# Patient Record
Sex: Female | Born: 1990
Health system: Southern US, Community
[De-identification: ages and names within clinical notes are randomized; demographics above are authoritative.]

## PROBLEM LIST (undated history)

## (undated) DIAGNOSIS — A879 Viral meningitis, unspecified: Secondary | ICD-10-CM

## (undated) DIAGNOSIS — B999 Unspecified infectious disease: Secondary | ICD-10-CM

## (undated) DIAGNOSIS — F329 Major depressive disorder, single episode, unspecified: Secondary | ICD-10-CM

## (undated) DIAGNOSIS — O24419 Gestational diabetes mellitus in pregnancy, unspecified control: Secondary | ICD-10-CM

## (undated) DIAGNOSIS — T8859XA Other complications of anesthesia, initial encounter: Secondary | ICD-10-CM

## (undated) DIAGNOSIS — Z8489 Family history of other specified conditions: Secondary | ICD-10-CM

## (undated) DIAGNOSIS — N809 Endometriosis, unspecified: Secondary | ICD-10-CM

## (undated) DIAGNOSIS — F419 Anxiety disorder, unspecified: Secondary | ICD-10-CM

## (undated) DIAGNOSIS — Z98891 History of uterine scar from previous surgery: Secondary | ICD-10-CM

## (undated) DIAGNOSIS — T4145XA Adverse effect of unspecified anesthetic, initial encounter: Secondary | ICD-10-CM

## (undated) DIAGNOSIS — F32A Depression, unspecified: Secondary | ICD-10-CM

## (undated) HISTORY — DX: Anxiety disorder, unspecified: F41.9

## (undated) HISTORY — DX: Depression, unspecified: F32.A

## (undated) HISTORY — DX: Viral meningitis, unspecified: A87.9

## (undated) HISTORY — DX: Endometriosis, unspecified: N80.9

## (undated) HISTORY — DX: Other complications of anesthesia, initial encounter: T88.59XA

## (undated) HISTORY — DX: History of uterine scar from previous surgery: Z98.891

---
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
## 2008-06-16 ENCOUNTER — Emergency Department (HOSPITAL_COMMUNITY): Admission: EM | Admit: 2008-06-16 | Discharge: 2008-06-16 | Payer: Self-pay | Admitting: Emergency Medicine

## 2008-07-21 ENCOUNTER — Emergency Department (HOSPITAL_COMMUNITY): Admission: EM | Admit: 2008-07-21 | Discharge: 2008-07-21 | Payer: Self-pay | Admitting: Emergency Medicine

## 2008-11-09 ENCOUNTER — Emergency Department (HOSPITAL_COMMUNITY): Admission: EM | Admit: 2008-11-09 | Discharge: 2008-11-09 | Payer: Self-pay | Admitting: Emergency Medicine

## 2008-11-17 ENCOUNTER — Encounter: Payer: Self-pay | Admitting: Family Medicine

## 2008-11-18 ENCOUNTER — Ambulatory Visit: Payer: Self-pay | Admitting: Family Medicine

## 2008-11-18 DIAGNOSIS — F3289 Other specified depressive episodes: Secondary | ICD-10-CM | POA: Insufficient documentation

## 2008-11-18 DIAGNOSIS — M545 Low back pain, unspecified: Secondary | ICD-10-CM | POA: Insufficient documentation

## 2008-11-18 DIAGNOSIS — F329 Major depressive disorder, single episode, unspecified: Secondary | ICD-10-CM | POA: Insufficient documentation

## 2008-12-03 ENCOUNTER — Encounter: Admission: RE | Admit: 2008-12-03 | Discharge: 2008-12-30 | Payer: Self-pay | Admitting: *Deleted

## 2008-12-16 ENCOUNTER — Ambulatory Visit: Payer: Self-pay | Admitting: Family Medicine

## 2008-12-30 ENCOUNTER — Encounter: Admission: RE | Admit: 2008-12-30 | Discharge: 2009-01-01 | Payer: Self-pay | Admitting: *Deleted

## 2009-01-01 ENCOUNTER — Telehealth: Payer: Self-pay | Admitting: Family Medicine

## 2009-01-04 ENCOUNTER — Encounter: Admission: RE | Admit: 2009-01-04 | Discharge: 2009-01-07 | Payer: Self-pay | Admitting: Family Medicine

## 2009-01-04 ENCOUNTER — Encounter (INDEPENDENT_AMBULATORY_CARE_PROVIDER_SITE_OTHER): Payer: Self-pay | Admitting: *Deleted

## 2009-01-04 DIAGNOSIS — F17200 Nicotine dependence, unspecified, uncomplicated: Secondary | ICD-10-CM | POA: Insufficient documentation

## 2009-02-17 ENCOUNTER — Encounter: Payer: Self-pay | Admitting: Family Medicine

## 2009-05-18 ENCOUNTER — Encounter: Payer: Self-pay | Admitting: Family Medicine

## 2009-05-18 ENCOUNTER — Ambulatory Visit: Payer: Self-pay | Admitting: Family Medicine

## 2009-05-18 LAB — CONVERTED CEMR LAB
Chlamydia, DNA Probe: NEGATIVE
GC Probe Amp, Genital: NEGATIVE
HCV Ab: NEGATIVE
Hep B S Ab: POSITIVE — AB
Hepatitis B Surface Ag: NEGATIVE
Pap Smear: NEGATIVE

## 2009-05-20 ENCOUNTER — Encounter: Payer: Self-pay | Admitting: Family Medicine

## 2010-02-01 NOTE — Letter (Signed)
Summary: Generic Letter  [facility]  [address]   [place], [place] [address]   Phone: [phone]  Fax: [phone]    05/20/2009  Patty Smith [address] [place], [place]  [address]  Dear [name],  I am happy to inform you that your pap smear was normal and your STD tests were negative.  If you have any questions, please call our office.          Sincerely,   Ardeen Garland  MD  Appended Document: Generic Letter mailed.

## 2010-02-01 NOTE — Consult Note (Signed)
Summary: [facility]  [facility]   Imported By: Clydell Hakim 03/05/2009 16:43:40  _____________________________________________________________________  External Attachment:    Type:   Image     Comment:   External Document  Appended Document: [facility] Pt discharged due to not following up.
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
## 2010-02-01 NOTE — Miscellaneous (Signed)
Summary: Tobacco Moani Weipert  Clinical Lists Changes  Problems: Added new problem of TOBACCO Adin Laker (ICD-305.1) 
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
## 2010-02-17 ENCOUNTER — Encounter: Payer: Self-pay | Admitting: *Deleted

## 2010-04-06 LAB — URINALYSIS, ROUTINE W REFLEX MICROSCOPIC
Bilirubin Urine: NEGATIVE
Glucose, UA: NEGATIVE mg/dL
Hgb urine dipstick: NEGATIVE
Ketones, ur: NEGATIVE mg/dL
Leukocytes, UA: NEGATIVE
Nitrite: POSITIVE — AB
Protein, ur: NEGATIVE mg/dL
Specific Gravity, Urine: 1.026 (ref 1.005–1.030)
Urobilinogen, UA: 1 mg/dL (ref 0.0–1.0)
pH: 6.5 (ref 5.0–8.0)

## 2010-04-06 LAB — URINE CULTURE: Colony Count: >=100000

## 2010-04-06 LAB — URINE MICROSCOPIC-ADD ON

## 2010-04-06 LAB — PREGNANCY, URINE: Preg Test, Ur: NEGATIVE

## 2010-05-20 NOTE — Discharge Summary (Signed)
NAME:  Patty Smith, Patty Smith                   ACCOUNT NO.:  [id number]   MEDICAL RECORD NO.:  [id number]          PATIENT TYPE:  INP   LOCATION:  6152                         FACILITY:  [facility]   PHYSICIAN:  Benn Moulder, M.D.      DATE OF BIRTH:  [birth year]   DATE OF ADMISSION:  07/27/2004  DATE OF DISCHARGE:  07/28/2004                                 DISCHARGE SUMMARY   HOSPITAL COURSE:  The patient is a 20 year old African-American female,  otherwise healthy, who presented with five days of fever and headache and  was seen yesterday in the emergency department.  The patient was admitted.  An LP was done which was consistent a viral meningitis.  The patient  received Tylenol No. 3 to control the pain of her headache.  She continued  her regular diet.  The patient also received a psychology consult while here  in the hospital as she is recently new to the area.   OPERATION/PROCEDURE:  Lumbar puncture under fluoro revealed 89 white blood  cells, 87% lymphocytes with an opening pressure normal at 19.   DIAGNOSIS:  Viral meningitis.   MEDICATIONS:  1.  Minocycline for acne.  2.  Tylenol No. 3.  3.  Ibuprofen 600 mg.   DISCHARGE WEIGHT:  49 kg.   DISCHARGE CONDITION:  Improved.   DISCHARGE INSTRUCTIONS AND FOLLOWUP:  The patient is to follow up with Dr.  Tama High within one to two weeks or sooner if the headache returns or  worsens despite the Tylenol No. 3.       MR/MEDQ  D:  07/28/2004  T:  07/28/2004  Job:  [id number]

## 2010-09-06 ENCOUNTER — Other Ambulatory Visit: Payer: Self-pay | Admitting: Family Medicine

## 2010-09-06 ENCOUNTER — Emergency Department (HOSPITAL_COMMUNITY)
Admission: EM | Admit: 2010-09-06 | Discharge: 2010-09-06 | Disposition: A | Discharge status: Home / Self Care | Payer: Self-pay | Attending: Emergency Medicine | Admitting: Emergency Medicine

## 2010-09-06 DIAGNOSIS — N644 Mastodynia: Secondary | ICD-10-CM | POA: Insufficient documentation

## 2010-09-06 DIAGNOSIS — N63 Unspecified lump in unspecified breast: Secondary | ICD-10-CM

## 2010-09-08 ENCOUNTER — Ambulatory Visit
Admission: RE | Admit: 2010-09-08 | Discharge: 2010-09-08 | Disposition: A | Discharge status: Home / Self Care | Payer: Self-pay | Source: Ambulatory Visit | Attending: Family Medicine | Admitting: Family Medicine

## 2010-09-08 DIAGNOSIS — N644 Mastodynia: Secondary | ICD-10-CM

## 2010-09-08 DIAGNOSIS — N63 Unspecified lump in unspecified breast: Secondary | ICD-10-CM

## 2010-09-30 LAB — CBC
HCT: 41.5
Hemoglobin: 13.6
MCHC: 32.7
MCV: 85.3
Platelets: 184
RBC: 4.86
RDW: 12.8
WBC: 7.3

## 2010-09-30 LAB — URINALYSIS, ROUTINE W REFLEX MICROSCOPIC
Bilirubin Urine: NEGATIVE
Glucose, UA: NEGATIVE
Hgb urine dipstick: NEGATIVE
Ketones, ur: NEGATIVE
Nitrite: NEGATIVE
Protein, ur: NEGATIVE
Specific Gravity, Urine: 1.024
Urobilinogen, UA: 0.2
pH: 6

## 2010-09-30 LAB — DIFFERENTIAL
Basophils Absolute: 0
Basophils Relative: 0
Eosinophils Absolute: 0.2
Eosinophils Relative: 3
Lymphocytes Relative: 39
Lymphs Abs: 2.8
Monocytes Absolute: 0.5
Monocytes Relative: 7
Neutro Abs: 3.7
Neutrophils Relative %: 51

## 2010-09-30 LAB — POCT I-STAT, CHEM 8
BUN: 19
Calcium, Ion: 1.12
Chloride: 106
Creatinine, Ser: 0.9
Glucose, Bld: 85
HCT: 43
Hemoglobin: 14.6
Potassium: 4.2
Sodium: 138
TCO2: 23

## 2010-09-30 LAB — SEDIMENTATION RATE: Sed Rate: 2

## 2010-10-10 LAB — URINE MICROSCOPIC-ADD ON

## 2010-10-10 LAB — URINALYSIS, ROUTINE W REFLEX MICROSCOPIC
Bilirubin Urine: NEGATIVE
Glucose, UA: NEGATIVE
Ketones, ur: 15 — AB
Nitrite: POSITIVE — AB
Protein, ur: 30 — AB
Specific Gravity, Urine: 1.026
Urobilinogen, UA: 1
pH: 7.5

## 2010-10-10 LAB — POCT PREGNANCY, URINE
Operator id: 22937
Preg Test, Ur: NEGATIVE

## 2015-12-25 ENCOUNTER — Encounter (HOSPITAL_COMMUNITY): Payer: Self-pay

## 2015-12-25 ENCOUNTER — Emergency Department (HOSPITAL_COMMUNITY): Payer: Medicaid Other

## 2015-12-25 ENCOUNTER — Emergency Department (HOSPITAL_COMMUNITY)
Admission: EM | Admit: 2015-12-25 | Discharge: 2015-12-25 | Disposition: A | Discharge status: Home / Self Care | Payer: Medicaid Other | Attending: Emergency Medicine | Admitting: Emergency Medicine

## 2015-12-25 DIAGNOSIS — F17210 Nicotine dependence, cigarettes, uncomplicated: Secondary | ICD-10-CM | POA: Insufficient documentation

## 2015-12-25 DIAGNOSIS — J069 Acute upper respiratory infection, unspecified: Secondary | ICD-10-CM | POA: Diagnosis present

## 2015-12-25 DIAGNOSIS — J111 Influenza due to unidentified influenza virus with other respiratory manifestations: Secondary | ICD-10-CM | POA: Diagnosis not present

## 2015-12-25 DIAGNOSIS — R6889 Other general symptoms and signs: Secondary | ICD-10-CM

## 2015-12-25 MED ORDER — HYDROCODONE-HOMATROPINE 5-1.5 MG/5ML PO SYRP: 5.0000 mL | ORAL_SOLUTION | Freq: Four times a day (QID) | ORAL | 0 refills | Status: DC | PRN

## 2015-12-25 MED ORDER — ACETAMINOPHEN 500 MG PO TABS
1000.0000 mg | ORAL_TABLET | Freq: Once | ORAL | Status: AC
  Administered 2015-12-25: 1000 mg via ORAL
  Filled 2015-12-25: qty 2

## 2015-12-25 NOTE — ED Notes (Signed)
Patient transported to X-ray 

## 2015-12-25 NOTE — ED Notes (Signed)
Patient is A&Ox4 at this time.  Patient in no signs of distress.  Please see providers note for complete history and physical exam.  

## 2015-12-25 NOTE — ED Provider Notes (Signed)
[facility]-EMERGENCY DEPT Provider Note   CSN: [id number] Arrival date & time: 12/25/15  1850   By signing my name below, I, Nelwyn SalisburyJoshua Fowler, attest that this documentation has been prepared under the direction and in the presence of non-physician practitioner, Roxy Horsemanobert Kesha Hurrell, PA-C. Electronically Signed: Nelwyn SalisburyJoshua Fowler, Scribe. 12/25/2015. 8:10 PM.   History   Chief Complaint Chief Complaint  Patient presents with  . Fever  . URI   The history is provided by the patient. No language interpreter was used.    Patty Smith is an otherwise healthy 25 y.o. female who presents to the Emergency Department with a chief complaint of gradual onset, gradually worsening intermittent productive cough beginning 3 days ago. Pt states that her symptoms began with a cough, but worsened to include diffuse body aches and fever (103.1). She has tried tylenol cold/flu and cough drops with no relief. She denies any dysuria or other symptoms. Pt did not get her flu shot this year.   History reviewed. No pertinent past medical history.  Patient Active Problem List   Diagnosis Date Noted  . TOBACCO USER 01/04/2009  . DEPRESSION 11/18/2008  . BACK PAIN, LUMBAR, CHRONIC 11/18/2008    Past Surgical History:  Procedure Laterality Date  . CESAREAN SECTION      OB History    No data available       Home Medications    Prior to Admission medications   Medication Sig Start Date End Date Taking? Authorizing Provider  DULoxetine (CYMBALTA) 30 MG capsule Take 30 mg by mouth daily.      Historical Provider, MD  ibuprofen (ADVIL,MOTRIN) 400 MG tablet Take 800 mg by mouth 3 (three) times daily as needed. for pain.  Take with meals     Historical Provider, MD  traMADol (ULTRAM) 50 MG tablet Take 50 mg by mouth every 6 (six) hours as needed. for pain     Historical Provider, MD    Family History History reviewed. No pertinent family history.  Social History Social History  Substance Use Topics  . Smoking  status: Current Every Day Smoker    Packs/day: 0.30    Types: Cigarettes  . Smokeless tobacco: Never Used  . Alcohol use Yes     Comment: occ     Allergies   Patient has no known allergies.   Review of Systems Review of Systems  Constitutional: Positive for fever.  Respiratory: Positive for cough.   Genitourinary: Negative for dysuria.  Musculoskeletal: Positive for myalgias.  All other systems reviewed and are negative.    Physical Exam Updated Vital Signs BP 136/96 (BP Location: Left Arm)   Pulse 111   Temp 99.9 F (37.7 C) (Oral)   Resp 18   LMP 12/06/2015   SpO2 100%   Physical Exam Physical Exam  Constitutional: Pt  is oriented to person, place, and time. Appears well-developed and well-nourished. No distress.  HENT:  Head: Normocephalic and atraumatic.  Right Ear: Tympanic membrane, external ear and ear canal normal.  Left Ear: Tympanic membrane, external ear and ear canal normal.  Nose: Mucosal edema and mild rhinorrhea present. No epistaxis. Right sinus exhibits no maxillary sinus tenderness and no frontal sinus tenderness. Left sinus exhibits no maxillary sinus tenderness and no frontal sinus tenderness.  Mouth/Throat: Uvula is midline and mucous membranes are normal. Mucous membranes are not pale and not cyanotic. No oropharyngeal exudate, posterior oropharyngeal edema, posterior oropharyngeal erythema or tonsillar abscesses.  Eyes: Conjunctivae are normal. Pupils are equal, round, and reactive  to light.  Neck: Normal range of motion and full passive range of motion without pain.  Cardiovascular: Normal rate and intact distal pulses.   Pulmonary/Chest: Effort normal and breath sounds normal. No stridor.  Clear and equal breath sounds without focal wheezes, rhonchi, rales  Abdominal: Soft. Bowel sounds are normal. There is no tenderness.  Musculoskeletal: Normal range of motion.  Lymphadenopathy:    Pthas no cervical adenopathy.  Neurological: Pt is alert  and oriented to person, place, and time.  Skin: Skin is warm and dry. No rash noted. Pt is not diaphoretic.  Psychiatric: Normal mood and affect.  Nursing note and vitals reviewed.   ED Treatments / Results  DIAGNOSTIC STUDIES:  Oxygen Saturation is 100% on RA, normal by my interpretation.    COORDINATION OF CARE:  8:13 PM Discussed treatment plan with pt at bedside which includes symptomatic management of flu symptoms and pt agreed to plan.  9:10 PM Spoke to patient concerning chest x-ray results.    Labs (all labs ordered are listed, but only abnormal results are displayed) Labs Reviewed - No data to display  EKG  EKG Interpretation None       Radiology Dg Chest 2 View  Result Date: 12/25/2015 CLINICAL DATA:  Flu like symptoms with body aches and cough since 12/22/2015. EXAM: CHEST  2 VIEW COMPARISON:  None. FINDINGS: Lungs are clear. Heart size is normal. No pneumothorax or pleural effusion. No bony abnormality. IMPRESSION: Negative chest. Electronically Signed   By: Drusilla Kannerhomas  Dalessio M.D.   On: 12/25/2015 20:58    Procedures Procedures (including critical care time)  Medications Ordered in ED Medications  acetaminophen (TYLENOL) tablet 1,000 mg (1,000 mg Oral Given 12/25/15 2005)     Initial Impression / Assessment and Plan / ED Course  I have reviewed the triage vital signs and the nursing notes.  Pertinent labs & imaging results that were available during my care of the patient were reviewed by me and considered in my medical decision making (see chart for details).  Clinical Course      Patient with symptoms consistent with influenza.  Vitals are stable, low-grade fever.  No signs of dehydration, tolerating PO's. Lungs are clear. Discussed Tamiful.  The patient understands that symptoms are greater than the recommended 24-48 hour window of treatment.  Patient will be discharged with instructions to orally hydrate, rest, and use over-the-counter medications  such as anti-inflammatories ibuprofen and Aleve for muscle aches and Tylenol for fever.  Patient will also be given a cough suppressant.    Final Clinical Impressions(s) / ED Diagnoses   Final diagnoses:  Flu-like symptoms    New Prescriptions Discharge Medication List as of 12/25/2015  9:13 PM    START taking these medications   Details  HYDROcodone-homatropine (HYCODAN) 5-1.5 MG/5ML syrup Take 5 mLs by mouth every 6 (six) hours as needed for cough., Starting Sat 12/25/2015, Print      I personally performed the services described in this documentation, which was scribed in my presence. The recorded information has been reviewed and is accurate.       Roxy HorsemanRobert Audray Rumore, PA-C 12/25/15 2124    Nira ConnPedro Eduardo Cardama, MD 12/26/15 [phone]

## 2015-12-25 NOTE — ED Triage Notes (Signed)
Onset 3 days ago productive cough, yellow with blood tinged sputum.  Onset last night fever and body aches.  No respiratory difficulties.

## 2015-12-25 NOTE — ED Notes (Signed)
Patient Alert and oriented X4. Stable and ambulatory. Patient verbalized understanding of the discharge instructions.  Patient belongings were taken by the patient.  

## 2016-06-18 ENCOUNTER — Encounter (HOSPITAL_COMMUNITY): Payer: Self-pay | Admitting: Emergency Medicine

## 2016-06-18 ENCOUNTER — Emergency Department (HOSPITAL_COMMUNITY)
Admission: EM | Admit: 2016-06-18 | Discharge: 2016-06-18 | Disposition: A | Discharge status: Home / Self Care | Payer: Medicaid Other | Attending: Emergency Medicine | Admitting: Emergency Medicine

## 2016-06-18 DIAGNOSIS — F17210 Nicotine dependence, cigarettes, uncomplicated: Secondary | ICD-10-CM | POA: Insufficient documentation

## 2016-06-18 DIAGNOSIS — Z79899 Other long term (current) drug therapy: Secondary | ICD-10-CM | POA: Diagnosis not present

## 2016-06-18 DIAGNOSIS — R112 Nausea with vomiting, unspecified: Secondary | ICD-10-CM | POA: Insufficient documentation

## 2016-06-18 LAB — COMPREHENSIVE METABOLIC PANEL
ALT: 15 U/L (ref 14–54)
AST: 18 U/L (ref 15–41)
Albumin: 4.1 g/dL (ref 3.5–5.0)
Alkaline Phosphatase: 47 U/L (ref 38–126)
Anion gap: 9 (ref 5–15)
BUN: 13 mg/dL (ref 6–20)
CO2: 24 mmol/L (ref 22–32)
Calcium: 9.4 mg/dL (ref 8.9–10.3)
Chloride: 106 mmol/L (ref 101–111)
Creatinine, Ser: 0.79 mg/dL (ref 0.44–1.00)
GFR calc Af Amer: >60 mL/min (ref >60)
GFR calc non Af Amer: >60 mL/min (ref >60)
Glucose, Bld: 145 mg/dL — ABNORMAL HIGH (ref 65–99)
Potassium: 4 mmol/L (ref 3.5–5.1)
Sodium: 139 mmol/L (ref 135–145)
Total Bilirubin: 0.8 mg/dL (ref 0.3–1.2)
Total Protein: 7.4 g/dL (ref 6.5–8.1)

## 2016-06-18 LAB — CBC
HCT: 44.6 % (ref 36.0–46.0)
Hemoglobin: 14.8 g/dL (ref 12.0–15.0)
MCH: 27.8 pg (ref 26.0–34.0)
MCHC: 33.2 g/dL (ref 30.0–36.0)
MCV: 83.7 fL (ref 78.0–100.0)
Platelets: 225 10*3/uL (ref 150–400)
RBC: 5.33 MIL/uL — ABNORMAL HIGH (ref 3.87–5.11)
RDW: 13.6 % (ref 11.5–15.5)
WBC: 10.6 10*3/uL — ABNORMAL HIGH (ref 4.0–10.5)

## 2016-06-18 LAB — LIPASE, BLOOD: Lipase: 18 U/L (ref 11–51)

## 2016-06-18 LAB — I-STAT BETA HCG BLOOD, ED (MC, WL, AP ONLY): I-stat hCG, quantitative: <5 m[IU]/mL (ref <5)

## 2016-06-18 MED ORDER — PROMETHAZINE HCL 25 MG PO TABS: 25.0000 mg | ORAL_TABLET | Freq: Three times a day (TID) | ORAL | 0 refills | Status: DC | PRN

## 2016-06-18 MED ORDER — SODIUM CHLORIDE 0.9 % IV BOLUS (SEPSIS)
1000.0000 mL | Freq: Once | INTRAVENOUS | Status: AC
  Administered 2016-06-18: 1000 mL via INTRAVENOUS

## 2016-06-18 MED ORDER — SODIUM CHLORIDE 0.9 % IV SOLN
Freq: Once | INTRAVENOUS | Status: AC
Start: 2016-06-18 — End: 2016-06-18
  Administered 2016-06-18: 05:00:00 via INTRAVENOUS

## 2016-06-18 MED ORDER — ONDANSETRON HCL 4 MG/2ML IJ SOLN
4.0000 mg | Freq: Once | INTRAMUSCULAR | Status: AC | PRN
  Administered 2016-06-18: 4 mg via INTRAVENOUS
  Filled 2016-06-18: qty 2

## 2016-06-18 MED ORDER — METOCLOPRAMIDE HCL 5 MG/ML IJ SOLN
10.0000 mg | Freq: Once | INTRAMUSCULAR | Status: AC
  Administered 2016-06-18: 10 mg via INTRAVENOUS
  Filled 2016-06-18: qty 2

## 2016-06-18 MED ORDER — PROMETHAZINE HCL 25 MG/ML IJ SOLN
25.0000 mg | Freq: Once | INTRAMUSCULAR | Status: AC
  Administered 2016-06-18: 25 mg via INTRAVENOUS
  Filled 2016-06-18: qty 1

## 2016-06-18 NOTE — ED Notes (Signed)
Pt is actively throwing up and cannot give a urine sample

## 2016-06-18 NOTE — ED Notes (Signed)
Patient mother calling to find out patient status. Gave patient phone in room so patient could speak with her mother.

## 2016-06-18 NOTE — ED Triage Notes (Signed)
Pt comes from home, via [facility]. Started vomiting 3hrs ago. Pt has chills and no pain. V/s 140/88, pulse 68 rr 20 spo2 100. Home address [address]

## 2016-06-18 NOTE — ED Notes (Signed)
Bed: ZO10WA13 Expected date:  Expected time:  Means of arrival:  Comments: 26 yo Abd pain

## 2016-06-18 NOTE — ED Notes (Signed)
Asked for urine  

## 2016-06-18 NOTE — ED Provider Notes (Signed)
[facility]-EMERGENCY DEPT Provider Note   CSN: [id number] Arrival date & time: 06/18/16  0441     History   Chief Complaint Chief Complaint  Patient presents with  . Nausea  . Emesis    HPI Patty Smith is a 26 y.o. female.  The history is provided by the patient and medical records.    26 year old female with history of depression, chronic back pain, presenting to the ED with nausea and vomiting.  She reports this started around 1:30 AM, woke her from sleep. States vomiting has been watery, nonbloody, nonbilious. She's not had any diarrhea. She denies any abdominal pain. States she has had some chills, but thinks it is from her vomiting.  States she thinks she may have gotten food poisoning. States yesterday she ate various foods including french fries, chicken wings, quesadilla, and chips with pico de gallo.  States she states the pico tasted weird but she continued eating anyway.  By the time of my evaluation patient received IV fluids and 4 mg Zofran but reports she still feels very nauseated and has vomited one additional time.  History reviewed. No pertinent past medical history.  Patient Active Problem List   Diagnosis Date Noted  . TOBACCO USER 01/04/2009  . DEPRESSION 11/18/2008  . BACK PAIN, LUMBAR, CHRONIC 11/18/2008    Past Surgical History:  Procedure Laterality Date  . CESAREAN SECTION      OB History    No data available       Home Medications    Prior to Admission medications   Medication Sig Start Date End Date Taking? Authorizing Provider  naproxen sodium (ANAPROX) 220 MG tablet Take 220 mg by mouth 2 (two) times daily with a meal.   Yes [provider]    Family History No family history on file.  Social History Social History  Substance Use Topics  . Smoking status: Current Every Day Smoker    Packs/day: 0.30    Types: Cigarettes  . Smokeless tobacco: Never Used  . Alcohol use Yes     Comment: occ     Allergies   Sulfur   Review  of Systems Review of Systems  Gastrointestinal: Positive for nausea and vomiting.  All other systems reviewed and are negative.    Physical Exam Updated Vital Signs BP (!) 173/91 (BP Location: Left Arm)   Pulse (!) 52   Temp 97.6 F (36.4 C) (Oral)   Resp 18   SpO2 100%   Physical Exam  Constitutional: She is oriented to person, place, and time. She appears well-developed and well-nourished.  HENT:  Head: Normocephalic and atraumatic.  Mouth/Throat: Oropharynx is clear and moist.  Mucous membranes appear dry  Eyes: Conjunctivae and EOM are normal. Pupils are equal, round, and reactive to light.  Neck: Normal range of motion.  Cardiovascular: Normal rate, regular rhythm and normal heart sounds.   Pulmonary/Chest: Effort normal and breath sounds normal.  Abdominal: Soft. Bowel sounds are normal. There is no tenderness. There is no rigidity and no guarding.  Minna soft, nontender, no distention, bowel sounds are normal  Musculoskeletal: Normal range of motion.  Neurological: She is alert and oriented to person, place, and time.  Skin: Skin is warm and dry.  Psychiatric: She has a normal mood and affect.  Nursing note and vitals reviewed.    ED Treatments / Results  Labs (all labs ordered are listed, but only abnormal results are displayed) Labs Reviewed  COMPREHENSIVE METABOLIC PANEL - Abnormal; Notable for the  following:       Result Value   Glucose, Bld 145 (*)    All other components within normal limits  CBC - Abnormal; Notable for the following:    WBC 10.6 (*)    RBC 5.33 (*)    All other components within normal limits  LIPASE, BLOOD  I-STAT BETA HCG BLOOD, ED ([facility], [facility], [facility] ONLY)    EKG  EKG Interpretation None       Radiology No results found.  Procedures Procedures (including critical care time)  Medications Ordered in ED Medications  ondansetron (ZOFRAN) injection 4 mg (4 mg Intravenous Given 06/18/16 0511)  0.9 %  sodium chloride infusion (  Intravenous Stopped 06/18/16 0634)  sodium chloride 0.9 % bolus 1,000 mL (0 mLs Intravenous Stopped 06/18/16 0703)  metoCLOPramide (REGLAN) injection 10 mg (10 mg Intravenous Given 06/18/16 0659)  promethazine (PHENERGAN) injection 25 mg (25 mg Intravenous Given 06/18/16 0728)     Initial Impression / Assessment and Plan / ED Course  I have reviewed the triage vital signs and the nursing notes.  Pertinent labs & imaging results that were available during my care of the patient were reviewed by me and considered in my medical decision making (see chart for details).  26 year old female here with nausea and vomiting. She is concerned she has food poisoning. Does report eating some pico salsa they tasted weird. She is afebrile and nontoxic. She has had a few episodes of vomiting here. Abdomen is benign. Screening lab work overall reassuring. Patient was given initial IV fluids and Zofran without much response. She was given additional Reglan and Phenergan with cessation of vomiting. She has been able to tolerate ice chips and small amounts of water without issue. States she feels ready to go home, feeling much better. Abdomen remains soft, benign.  We'll plan to discharge home with Phenergan tablets. Encouraged gentle diet, oral fluids. Can follow-up with PCP if any ongoing issues.  Discussed plan with patient, she acknowledged understanding and agreed with plan of care.  Return precautions given for new or worsening symptoms.  Final Clinical Impressions(s) / ED Diagnoses   Final diagnoses:  Non-intractable vomiting with nausea, unspecified vomiting type    New Prescriptions New Prescriptions   PROMETHAZINE (PHENERGAN) 25 MG TABLET    Take 1 tablet (25 mg total) by mouth every 8 (eight) hours as needed for nausea or vomiting.     Garlon HatchetSanders, Lisa M, PA-C 06/18/16 1036    Melene PlanFloyd, Dan, DO 06/19/16 1200

## 2016-06-18 NOTE — Discharge Instructions (Signed)
Take the prescribed medication as directed for nausea.  Recommend gentle diet and lots of fluids for the next 24-48 hours until your stomach settles out. Follow-up with your primary care doctor. Return to the ED for new or worsening symptoms.

## 2017-03-30 ENCOUNTER — Ambulatory Visit (HOSPITAL_BASED_OUTPATIENT_CLINIC_OR_DEPARTMENT_OTHER): Admit: 2017-03-30 | Payer: Self-pay | Admitting: Dentistry

## 2017-03-30 ENCOUNTER — Encounter (HOSPITAL_BASED_OUTPATIENT_CLINIC_OR_DEPARTMENT_OTHER): Payer: Self-pay

## 2017-03-30 SURGERY — DENTAL RESTORATION/EXTRACTION WITH X-RAY
Anesthesia: General

## 2017-05-01 ENCOUNTER — Encounter

## 2017-09-28 ENCOUNTER — Ambulatory Visit (HOSPITAL_COMMUNITY)
Admission: EM | Admit: 2017-09-28 | Discharge: 2017-09-28 | Disposition: A | Discharge status: Home / Self Care | Payer: Self-pay | Attending: Family Medicine | Admitting: Family Medicine

## 2017-09-28 ENCOUNTER — Encounter (HOSPITAL_COMMUNITY): Payer: Self-pay | Admitting: Emergency Medicine

## 2017-09-28 DIAGNOSIS — G8929 Other chronic pain: Secondary | ICD-10-CM | POA: Insufficient documentation

## 2017-09-28 DIAGNOSIS — R102 Pelvic and perineal pain: Secondary | ICD-10-CM | POA: Insufficient documentation

## 2017-09-28 DIAGNOSIS — Z3202 Encounter for pregnancy test, result negative: Secondary | ICD-10-CM

## 2017-09-28 DIAGNOSIS — N898 Other specified noninflammatory disorders of vagina: Secondary | ICD-10-CM | POA: Insufficient documentation

## 2017-09-28 DIAGNOSIS — F17210 Nicotine dependence, cigarettes, uncomplicated: Secondary | ICD-10-CM | POA: Insufficient documentation

## 2017-09-28 LAB — POCT URINALYSIS DIP (DEVICE)
Bilirubin Urine: NEGATIVE
Glucose, UA: NEGATIVE mg/dL
Ketones, ur: NEGATIVE mg/dL
Leukocytes, UA: NEGATIVE
Nitrite: NEGATIVE
Protein, ur: NEGATIVE mg/dL
Specific Gravity, Urine: 1.025 (ref 1.005–1.030)
Urobilinogen, UA: 1 mg/dL (ref 0.0–1.0)
pH: 6 (ref 5.0–8.0)

## 2017-09-28 LAB — POCT PREGNANCY, URINE: Preg Test, Ur: NEGATIVE

## 2017-09-28 NOTE — Discharge Instructions (Addendum)
Urine showed trace blood.  This could be related to upcoming menstrual cycle. Have urine recheck in 1-2 weeks to make sure this has resolved.  Urine pregnancy was negative Vaginal swab obtained.  We will follow up with you regarding the results of your test.  If any results are positive, please abstain from sexual activity for at least 7 days until you are treated.   Recommend further evaluation and management with GYN at this time for chronic pelvic discomfort. Return or go to the ED if you have any new or worsening symptoms such as fever, chills, nausea, vomiting, abdominal pain, flank pain, changes in bowel or bladder, etc..Marland Kitchen

## 2017-09-28 NOTE — ED Provider Notes (Signed)
[facility]   [id number] 09/28/17 Arrival Time: 9562  CC: Pelvic discomfort  SUBJECTIVE:  Patty Smith is a 27 y.o. female who presents with complaint of recurring right sided pelvic discomfort for the past year, recent episode for the past 2 days.  Associated with menstrual cycle.  Localizes pain to right sided groin/ pelvic region.  Describes as intermittent, stable, and dull in character.  Has tried medications including naproxen with mild relief.  Worse with walking. Complains of associated dysmenorrhea.    Denies fever, chills, appetite changes, weight changes, nausea, vomiting, chest pain, SOB, diarrhea, constipation, hematochezia, melena, dysuria, difficulty urinating, increased frequency or urgency, flank pain, loss of bowel or bladder function.    Patient's last menstrual period was 08/28/2017.  Last unprotected sex 2 weeks ago.  1 female partner for the past year.  Not currently on birth control  Family hx significant for endometriosis in mother  ROS: As per HPI.  History reviewed. No pertinent past medical history. Past Surgical History:  Procedure Laterality Date  . CESAREAN SECTION     Allergies  Allergen Reactions  . Sulfur     Unknown rxn   No current facility-administered medications on file prior to encounter.    Current Outpatient Medications on File Prior to Encounter  Medication Sig Dispense Refill  . naproxen sodium (ANAPROX) 220 MG tablet Take 220 mg by mouth 2 (two) times daily with a meal.     Social History   Socioeconomic History  . Marital status: Legally Separated    Spouse name: Not on file  . Number of children: Not on file  . Years of education: Not on file  . Highest education level: Not on file  Occupational History  . Not on file  Social Needs  . Financial resource strain: Not on file  . Food insecurity:    Worry: Not on file    Inability: Not on file  . Transportation needs:    Medical: Not on file    Non-medical: Not on  file  Tobacco Use  . Smoking status: Current Every Day Smoker    Packs/day: 0.30    Types: Cigarettes  . Smokeless tobacco: Never Used  Substance and Sexual Activity  . Alcohol use: Yes    Comment: occ  . Drug use: No  . Sexual activity: Not on file  Lifestyle  . Physical activity:    Days per week: Not on file    Minutes per session: Not on file  . Stress: Not on file  Relationships  . Social connections:    Talks on phone: Not on file    Gets together: Not on file    Attends religious service: Not on file    Active member of club or organization: Not on file    Attends meetings of clubs or organizations: Not on file    Relationship status: Not on file  . Intimate partner violence:    Fear of current or ex partner: Not on file    Emotionally abused: Not on file    Physically abused: Not on file    Forced sexual activity: Not on file  Other Topics Concern  . Not on file  Social History Narrative  . Not on file   Family History  Problem Relation Age of Onset  . Heart failure Father   . Hypertension Father   . Diabetes Father      OBJECTIVE:  Vitals:   09/28/17 1011  BP: 124/80  Pulse: 92  Resp: 14  Temp: 98.5 F (36.9 C)  SpO2: 100%    General appearance: AOx3 in no acute distress HEENT: NCAT.  Oropharynx clear.  Lungs: clear to auscultation bilaterally without adventitious breath sounds Heart: regular rate and rhythm.  Radial pulses 2+ symmetrical bilaterally Abdomen: soft, non-distended; normal active bowel sounds; non-tender to light and deep palpation; nontender at McBurney's point; negative Murphy's sign; negative rebound; no guarding GU: Declines chaperone: On external examination no obvious lesions, discharge, or masses Bimanual exam performed prior to speculum exam.  Negative cervical motion or adenexal tenderness Speculum exam: Thick white discharge appreciated during pelvic exam.  Cervix not visualized.  Vaginal swab obtained.   Extremities: no  edema; symmetrical with no gross deformities Skin: warm and dry Neurologic: normal gait Psychological: alert and cooperative; normal mood and affect  Labs: Results for orders placed or performed during the hospital encounter of 09/28/17 (from the past 24 hour(s))  POCT urinalysis dip (device)     Status: Abnormal   Collection Time: 09/28/17 10:46 AM  Result Value Ref Range   Glucose, UA NEGATIVE NEGATIVE mg/dL   Bilirubin Urine NEGATIVE NEGATIVE   Ketones, ur NEGATIVE NEGATIVE mg/dL   Specific Gravity, Urine 1.025 1.005 - 1.030   Hgb urine dipstick TRACE (A) NEGATIVE   pH 6.0 5.0 - 8.0   Protein, ur NEGATIVE NEGATIVE mg/dL   Urobilinogen, UA 1.0 0.0 - 1.0 mg/dL   Nitrite NEGATIVE NEGATIVE   Leukocytes, UA NEGATIVE NEGATIVE    ASSESSMENT & PLAN:  1. Chronic pelvic pain in female   2. Vaginal discharge     No orders of the defined types were placed in this encounter.  Urine showed trace blood.  This could be related to upcoming menstrual cycle. Have urine rechecked in 1-2 weeks to make sure this has resolved.  Urine pregnancy was negative Vaginal swab obtained.  We will follow up with you regarding the results of your test.  If any results are positive, please abstain from sexual activity for at least 7 days until you are treated.   Recommend further evaluation and management with GYN at this time for chronic pelvic discomfort. Return or go to the ED if you have any new or worsening symptoms such as fever, chills, nausea, vomiting, abdominal pain, flank pain, changes in bowel or bladder, etc...  Reviewed expectations re: course of current medical issues. Questions answered. Outlined signs and symptoms indicating need for more acute intervention. Patient verbalized understanding. After Visit Summary given.   Rennis Harding, PA-C 09/28/17 1120    Isa Rankin, MD 10/09/17 1539

## 2017-09-28 NOTE — ED Triage Notes (Signed)
Pt c/o RLQ pelvic pain x1 year, states she feels like theres a knot down there, states she feels the knot when shes on her cycle. Denies issues with urination. Denies n/v/d at this time. In nad.

## 2017-10-01 ENCOUNTER — Telehealth (HOSPITAL_COMMUNITY): Payer: Self-pay

## 2017-10-01 LAB — CERVICOVAGINAL ANCILLARY ONLY
Bacterial vaginitis: POSITIVE — AB
Candida vaginitis: NEGATIVE
Chlamydia: NEGATIVE
Neisseria Gonorrhea: NEGATIVE
Trichomonas: NEGATIVE

## 2017-10-01 MED ORDER — METRONIDAZOLE 500 MG PO TABS: 500.0000 mg | ORAL_TABLET | Freq: Two times a day (BID) | ORAL | 0 refills | Status: DC

## 2017-10-01 NOTE — Telephone Encounter (Signed)
Bacterial vaginosis is positive. This was not treated at the urgent care visit.  Patient complains of persistent symptoms.  Flagyl 500 mg BID x 7 days #14 no refills sent to patients pharmacy of choice per Dr. Murray.  Pt called and made aware of results and new prescription. Answered all questions and pt verbalized understanding.  

## 2017-10-03 ENCOUNTER — Telehealth: Payer: Self-pay | Admitting: *Deleted

## 2017-10-03 NOTE — Telephone Encounter (Signed)
Pt called office and spoke w/Antoinette Clinton. She stated that she had been seen @ ED and was told by a nurse that she may need ultrasound. She does not have a Gyn provider and wants to know how to get the ultrasound scheduled. I reviewed provider notes from the ED visit and  returned pt's call @ 0920. I explained that the recommendation from the ED provider was for her to have follow up and evaluation with a Gyn provider. That provider will determine the plan of care needed which may or may not include an ultrasound or labs. Pt voiced understanding and stated that she does not have an established Gyn provider and would like appointment in our office. I advised that she will be contacted with appointment information. Pt voiced understanding and said that a detailed voice mail message can be left if she does not answer.

## 2017-10-22 ENCOUNTER — Encounter: Payer: Medicaid Other | Admitting: Obstetrics & Gynecology

## 2017-10-31 ENCOUNTER — Ambulatory Visit (INDEPENDENT_AMBULATORY_CARE_PROVIDER_SITE_OTHER): Payer: Self-pay | Admitting: Obstetrics & Gynecology

## 2017-10-31 ENCOUNTER — Encounter: Payer: Self-pay | Admitting: Obstetrics & Gynecology

## 2017-10-31 ENCOUNTER — Other Ambulatory Visit (HOSPITAL_COMMUNITY)
Admission: RE | Admit: 2017-10-31 | Discharge: 2017-10-31 | Disposition: A | Discharge status: Home / Self Care | Payer: Medicaid Other | Source: Ambulatory Visit | Attending: Obstetrics & Gynecology | Admitting: Obstetrics & Gynecology

## 2017-10-31 VITALS — BP 122/90 | HR 85 | Wt 199.5 lb

## 2017-10-31 DIAGNOSIS — R102 Pelvic and perineal pain: Secondary | ICD-10-CM

## 2017-10-31 DIAGNOSIS — Z0000 Encounter for general adult medical examination without abnormal findings: Secondary | ICD-10-CM | POA: Diagnosis present

## 2017-10-31 DIAGNOSIS — Z23 Encounter for immunization: Secondary | ICD-10-CM

## 2017-10-31 MED ORDER — LEVONORGEST-ETH ESTRAD 91-DAY 0.15-0.03 &0.01 MG PO TABS: 1.0000 | ORAL_TABLET | Freq: Every day | ORAL | 4 refills | Status: DC

## 2017-10-31 NOTE — Progress Notes (Signed)
Patient ID: Patty Smith, female   DOB: [birth year], 27 y.o.   MRN: [id number]  Chief Complaint  Patient presents with  . Pelvic Pain    HPI Patty Smith is a 27 y.o. female.  Divorced P80 (5 yo son) here today with the issue of a "mass" in her right groin for about a year, enlarges with her period which is monthly. Nothing makes it better or worse. It is very painful when it is present. She reports that her periods last abou 5 days per month. They are VERY painful. She currently uses condoms for contraception but she did use depo provera in the past and although she bled daily for a year, her pain was improved. Her mom has endometriosis.    Past Medical History:  Diagnosis Date  . Viral meningitis     Past Surgical History:  Procedure Laterality Date  . CESAREAN SECTION      Family History  Problem Relation Age of Onset  . Heart failure Father   . Hypertension Father   . Diabetes Father   . Endometriosis Mother   . Depression Mother   . COPD Mother     Social History Social History   Tobacco Use  . Smoking status: Current Every Day Smoker    Packs/day: 0.30    Types: Cigarettes  . Smokeless tobacco: Never Used  Substance Use Topics  . Alcohol use: Yes    Comment: occ  . Drug use: No    Allergies  Allergen Reactions  . Sulfur     Unknown rxn    Current Outpatient Medications  Medication Sig Dispense Refill  . naproxen sodium (ANAPROX) 220 MG tablet Take 220 mg by mouth 2 (two) times daily with a meal.    . metroNIDAZOLE (FLAGYL) 500 MG tablet Take 1 tablet (500 mg total) by mouth 2 (two) times daily. 14 tablet 0   No current facility-administered medications for this visit.     Review of Systems Review of Systems  Blood pressure 122/90, pulse 85, weight 199 lb 8 oz (90.5 kg), last menstrual period 10/01/2017.  Physical Exam Physical Exam Breathing, conversing, and ambulating normally Well nourished, well hydrated Black female, no apparent distress Abd-  benign Right groin with a 3x 2 cm firm, tender mass Cervix appears normal normal size and shape, anteverted, mobile, non-tender, normal adnexal exam  Data Reviewed Pap smear normal 2011  Assessment    Preventative care  dysmenorrhea Right groin mass c/w endometriosis Plan   TDAP and flu vaccines today Pap smear done today I offered her continuous OCPs versus diagnostic laparoscopy and excision of groin mass. She opts for OCPs Come back 4 months/prn sooner I have rec'd that she get on Mychart        Allie Bossier 10/31/2017, 10:24 AM

## 2017-11-01 LAB — CYTOLOGY - PAP
Chlamydia: NEGATIVE
Diagnosis: NEGATIVE
Neisseria Gonorrhea: NEGATIVE

## 2018-11-29 ENCOUNTER — Other Ambulatory Visit: Payer: Self-pay

## 2018-11-29 ENCOUNTER — Ambulatory Visit (HOSPITAL_COMMUNITY)
Admission: EM | Admit: 2018-11-29 | Discharge: 2018-11-29 | Disposition: A | Discharge status: Home / Self Care | Payer: Self-pay | Attending: Emergency Medicine | Admitting: Emergency Medicine

## 2018-11-29 ENCOUNTER — Encounter (HOSPITAL_COMMUNITY): Payer: Self-pay

## 2018-11-29 DIAGNOSIS — K029 Dental caries, unspecified: Secondary | ICD-10-CM

## 2018-11-29 MED ORDER — ACETAMINOPHEN 500 MG PO TABS: 1000.0000 mg | ORAL_TABLET | Freq: Three times a day (TID) | ORAL | 0 refills | Status: DC | PRN

## 2018-11-29 MED ORDER — ONDANSETRON HCL 4 MG PO TABS: 4.0000 mg | ORAL_TABLET | Freq: Three times a day (TID) | ORAL | 0 refills | Status: DC | PRN

## 2018-11-29 MED ORDER — AMOXICILLIN-POT CLAVULANATE 875-125 MG PO TABS: 1.0000 | ORAL_TABLET | Freq: Two times a day (BID) | ORAL | 0 refills | Status: DC

## 2018-11-29 NOTE — ED Provider Notes (Addendum)
[place]    CSN: [id number] Arrival date & time: 11/29/18  0932      History   Chief Complaint Chief Complaint  Patient presents with  . Rt facial pain, nausea    HPI Patty Smith is a 28 y.o. female.   50 y old female presented to the  to UC w/ c/o pain on right side X 1 week, pt believes it is tooth pain.She states pain is making her nauseous. Pt has taken tylenol or naproxen w/o relief. Pt denies sinus pain.  Denied chills and fever  The history is provided by the patient. No language interpreter was used.    Past Medical History:  Diagnosis Date  . Viral meningitis     Patient Active Problem List   Diagnosis Date Noted  . TOBACCO USER 01/04/2009  . DEPRESSION 11/18/2008  . BACK PAIN, LUMBAR, CHRONIC 11/18/2008    Past Surgical History:  Procedure Laterality Date  . CESAREAN SECTION      OB History   No obstetric history on file.      Home Medications    Prior to Admission medications   Medication Sig Start Date End Date Taking? Authorizing Provider  acetaminophen (TYLENOL) 500 MG tablet Take 2 tablets (1,000 mg total) by mouth every 8 (eight) hours as needed. 11/29/18   Myrtice Lowdermilk, Darrelyn Hillock, FNP  amoxicillin-clavulanate (AUGMENTIN) 875-125 MG tablet Take 1 tablet by mouth every 12 (twelve) hours. 11/29/18   Olean Sangster, Darrelyn Hillock, FNP  Levonorgestrel-Ethinyl Estradiol (AMETHIA,CAMRESE) 0.15-0.03 &0.01 MG tablet Take 1 tablet by mouth daily. 10/31/17   Emily Filbert, MD  naproxen sodium (ANAPROX) 220 MG tablet Take 220 mg by mouth 2 (two) times daily with a meal.    [provider]  ondansetron (ZOFRAN) 4 MG tablet Take 1 tablet (4 mg total) by mouth every 8 (eight) hours as needed for nausea or vomiting. 11/29/18   AvegnoDarrelyn Hillock, FNP    Family History Family History  Problem Relation Age of Onset  . Heart failure Father   . Hypertension Father   . Diabetes Father   . Endometriosis Mother   . Depression Mother   . COPD Mother     Social History Social History   Tobacco Use  . Smoking status: Current Every Day Smoker    Packs/day: 0.30    Types: Cigarettes  . Smokeless tobacco: Never Used  Substance Use Topics  . Alcohol use: Yes    Comment: occ  . Drug use: No     Allergies   Sulfur   Review of Systems Review of Systems  Constitutional: Negative for activity change, appetite change, chills, fatigue and fever.  HENT: Positive for dental problem. Negative for congestion, sinus pressure, sinus pain and sore throat.   Respiratory: Negative for cough, chest tightness and shortness of breath.   Cardiovascular: Negative for chest pain.  Gastrointestinal: Positive for nausea.     Physical Exam Triage Vital Signs ED Triage Vitals  Enc Vitals Group     BP 11/29/18 1018 (!) 161/101     Pulse Rate 11/29/18 1018 78     Resp 11/29/18 1018 16     Temp 11/29/18 1018 98.6 F (37 C)     Temp Source 11/29/18 1018 Oral     SpO2 11/29/18 1018 98 %     Weight --      Height --      Head Circumference --      Peak Flow --  Pain Score 11/29/18 1022 10     Pain Loc --      Pain Edu? --      Excl. in GC? --    No data found.  Updated Vital Signs BP (!) 161/101 (BP Location: Right Arm)   Pulse 78   Temp 98.6 F (37 C) (Oral)   Resp 16   SpO2 98%   Visual Acuity Right Eye Distance:   Left Eye Distance:   Bilateral Distance:    Right Eye Near:   Left Eye Near:    Bilateral Near:     Physical Exam Constitutional:      General: She is not in acute distress.    Appearance: Normal appearance. She is normal weight. She is not ill-appearing or toxic-appearing.  HENT:     Nose: Nose normal.     Mouth/Throat:     Mouth: Mucous membranes are moist.   Cardiovascular:     Rate and Rhythm: Normal rate and regular rhythm.     Pulses: Normal pulses.     Heart sounds: Normal heart sounds. No murmur.  Pulmonary:     Effort: Pulmonary effort is normal. No respiratory distress.     Breath sounds:  Normal breath sounds. No wheezing or rhonchi.  Chest:     Chest wall: No tenderness.  Neurological:     Mental Status: She is alert.      UC Treatments / Results  Labs (all labs ordered are listed, but only abnormal results are displayed) Labs Reviewed - No data to display  EKG   Radiology No results found.  Procedures Procedures (including critical care time)  Medications Ordered in UC Medications - No data to display  Initial Impression / Assessment and Plan / UC Course  I have reviewed the triage vital signs and the nursing notes.  Pertinent labs & imaging results that were available during my care of the patient were reviewed by me and considered in my medical decision making (see chart for details).    Will prescribe Augmemtin to treat the dental carries. Tylenol for pain and Zofran for Nausea. Advised to follow up with a dentist  Final Clinical Impressions(s) / UC Diagnoses   Final diagnoses:  Dental cavities     Discharge Instructions     Patient was advised to complete her antibiotic course Take medication as prescribed Low cost dental resource was provided  Return to the urgent care if symptom get worse    ED Prescriptions    Medication Sig Dispense Auth. Provider   amoxicillin-clavulanate (AUGMENTIN) 875-125 MG tablet Take 1 tablet by mouth every 12 (twelve) hours. 14 tablet Peytan Andringa S, FNP   ondansetron (ZOFRAN) 4 MG tablet  (Status: Discontinued) Take 1 tablet (4 mg total) by mouth every 8 (eight) hours as needed for nausea or vomiting. 4 tablet Solomon Skowronek, Zachery Dakins, FNP   acetaminophen (TYLENOL) 500 MG tablet Take 2 tablets (1,000 mg total) by mouth every 8 (eight) hours as needed. 30 tablet Kasir Hallenbeck S, FNP   ondansetron (ZOFRAN) 4 MG tablet Take 1 tablet (4 mg total) by mouth every 8 (eight) hours as needed for nausea or vomiting. 16 tablet Avyn Coate, Zachery Dakins, FNP     PDMP not reviewed this encounter.   Durward Parcel, FNP  11/29/18 1127    Durward Parcel, FNP 11/29/18 1248

## 2018-11-29 NOTE — Discharge Instructions (Addendum)
Patient was advised to complete her antibiotic course Take medication as prescribed Low cost dental resource was provided  Return to the urgent care if symptom get worse

## 2018-11-29 NOTE — ED Triage Notes (Signed)
Pt presents to UC w/ c/o pain on right side of face to behind eye, pt believes it is tooth pain. Pt states pain is making her nauseous. Pt has taken tylenol or naproxen w/o relief. Pt denies sinus pain. Pt states she has had this pain for 1 week.

## 2019-01-02 ENCOUNTER — Encounter (HOSPITAL_COMMUNITY): Payer: Self-pay | Admitting: Emergency Medicine

## 2019-01-02 ENCOUNTER — Emergency Department (HOSPITAL_COMMUNITY): Payer: Self-pay

## 2019-01-02 ENCOUNTER — Other Ambulatory Visit: Payer: Self-pay

## 2019-01-02 ENCOUNTER — Emergency Department (HOSPITAL_COMMUNITY)
Admission: EM | Admit: 2019-01-02 | Discharge: 2019-01-02 | Disposition: A | Discharge status: Home / Self Care | Payer: Self-pay | Attending: Emergency Medicine | Admitting: Emergency Medicine

## 2019-01-02 DIAGNOSIS — R1011 Right upper quadrant pain: Secondary | ICD-10-CM | POA: Insufficient documentation

## 2019-01-02 DIAGNOSIS — Z79899 Other long term (current) drug therapy: Secondary | ICD-10-CM | POA: Insufficient documentation

## 2019-01-02 DIAGNOSIS — R112 Nausea with vomiting, unspecified: Secondary | ICD-10-CM | POA: Insufficient documentation

## 2019-01-02 DIAGNOSIS — R1013 Epigastric pain: Secondary | ICD-10-CM | POA: Insufficient documentation

## 2019-01-02 DIAGNOSIS — F17210 Nicotine dependence, cigarettes, uncomplicated: Secondary | ICD-10-CM | POA: Insufficient documentation

## 2019-01-02 LAB — COMPREHENSIVE METABOLIC PANEL
ALT: 17 U/L (ref 0–44)
AST: 15 U/L (ref 15–41)
Albumin: 3.5 g/dL (ref 3.5–5.0)
Alkaline Phosphatase: 32 U/L — ABNORMAL LOW (ref 38–126)
Anion gap: 11 (ref 5–15)
BUN: 11 mg/dL (ref 6–20)
CO2: 21 mmol/L — ABNORMAL LOW (ref 22–32)
Calcium: 9 mg/dL (ref 8.9–10.3)
Chloride: 107 mmol/L (ref 98–111)
Creatinine, Ser: 0.75 mg/dL (ref 0.44–1.00)
GFR calc Af Amer: >60 mL/min (ref >60)
GFR calc non Af Amer: >60 mL/min (ref >60)
Glucose, Bld: 147 mg/dL — ABNORMAL HIGH (ref 70–99)
Potassium: 3.8 mmol/L (ref 3.5–5.1)
Sodium: 139 mmol/L (ref 135–145)
Total Bilirubin: 0.3 mg/dL (ref 0.3–1.2)
Total Protein: 6.8 g/dL (ref 6.5–8.1)

## 2019-01-02 LAB — CBC
HCT: 46 % (ref 36.0–46.0)
Hemoglobin: 14.7 g/dL (ref 12.0–15.0)
MCH: 28.7 pg (ref 26.0–34.0)
MCHC: 32 g/dL (ref 30.0–36.0)
MCV: 89.8 fL (ref 80.0–100.0)
Platelets: 227 10*3/uL (ref 150–400)
RBC: 5.12 MIL/uL — ABNORMAL HIGH (ref 3.87–5.11)
RDW: 13.2 % (ref 11.5–15.5)
WBC: 8.9 10*3/uL (ref 4.0–10.5)
nRBC: 0 % (ref 0.0–0.2)

## 2019-01-02 LAB — TROPONIN I (HIGH SENSITIVITY)
Troponin I (High Sensitivity): <2 ng/L (ref <18)
Troponin I (High Sensitivity): 2 ng/L (ref <18)

## 2019-01-02 LAB — I-STAT BETA HCG BLOOD, ED (MC, WL, AP ONLY): I-stat hCG, quantitative: <5 m[IU]/mL (ref <5)

## 2019-01-02 LAB — LIPASE, BLOOD: Lipase: 19 U/L (ref 11–51)

## 2019-01-02 MED ORDER — SODIUM CHLORIDE 0.9% FLUSH: 3.0000 mL | Freq: Once | INTRAVENOUS | Status: DC

## 2019-01-02 MED ORDER — METOCLOPRAMIDE HCL 10 MG PO TABS: 10.0000 mg | ORAL_TABLET | Freq: Four times a day (QID) | ORAL | 0 refills | Status: DC

## 2019-01-02 MED ORDER — ONDANSETRON HCL 4 MG/2ML IJ SOLN
4.0000 mg | Freq: Once | INTRAMUSCULAR | Status: AC
  Administered 2019-01-02: 4 mg via INTRAVENOUS
  Filled 2019-01-02: qty 2

## 2019-01-02 MED ORDER — MORPHINE SULFATE (PF) 4 MG/ML IV SOLN
4.0000 mg | Freq: Once | INTRAVENOUS | Status: AC
  Administered 2019-01-02: 4 mg via INTRAVENOUS
  Filled 2019-01-02: qty 1

## 2019-01-02 MED ORDER — METOCLOPRAMIDE HCL 5 MG/ML IJ SOLN
10.0000 mg | Freq: Once | INTRAMUSCULAR | Status: AC
  Administered 2019-01-02: 10 mg via INTRAVENOUS
  Filled 2019-01-02: qty 2
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
## 2019-01-02 NOTE — ED Notes (Signed)
Discharge instructions and prescription discussed with Pt. Pt verbalized understanding. Pt stable and ambulatory.    

## 2019-01-02 NOTE — ED Notes (Signed)
Pt actively vomiting.

## 2019-01-02 NOTE — ED Notes (Signed)
Patient transported to US 

## 2019-01-02 NOTE — ED Notes (Signed)
Patient given water and crackers for PO challenge.

## 2019-01-02 NOTE — Discharge Instructions (Addendum)
Take Reglan every 6 hours as needed for nausea or vomiting.  Make sure to stay well-hydrated.  I recommend avoiding marijuana in the future, as this could be a cause of your symptoms.  Please return the emergency department if you develop any severe worsening abdominal pain, intractable vomiting, chest pain, or any other concerning symptoms.

## 2019-01-02 NOTE — ED Triage Notes (Signed)
Pt. Stated.I woke up this morning with N/V

## 2019-01-02 NOTE — ED Notes (Signed)
Patient transported to X-ray 

## 2019-05-08 ENCOUNTER — Emergency Department (HOSPITAL_COMMUNITY)
Admission: EM | Admit: 2019-05-08 | Discharge: 2019-05-08 | Disposition: A | Discharge status: Home / Self Care | Payer: Medicaid Other | Attending: Emergency Medicine | Admitting: Emergency Medicine

## 2019-05-08 ENCOUNTER — Encounter (HOSPITAL_COMMUNITY): Payer: Self-pay | Admitting: Emergency Medicine

## 2019-05-08 ENCOUNTER — Other Ambulatory Visit: Payer: Self-pay

## 2019-05-08 DIAGNOSIS — N939 Abnormal uterine and vaginal bleeding, unspecified: Secondary | ICD-10-CM | POA: Insufficient documentation

## 2019-05-08 DIAGNOSIS — R110 Nausea: Secondary | ICD-10-CM | POA: Diagnosis not present

## 2019-05-08 DIAGNOSIS — R109 Unspecified abdominal pain: Secondary | ICD-10-CM | POA: Diagnosis present

## 2019-05-08 DIAGNOSIS — Z5321 Procedure and treatment not carried out due to patient leaving prior to being seen by health care provider: Secondary | ICD-10-CM | POA: Insufficient documentation

## 2019-05-08 LAB — URINALYSIS, ROUTINE W REFLEX MICROSCOPIC
Bilirubin Urine: NEGATIVE
Glucose, UA: NEGATIVE mg/dL
Ketones, ur: NEGATIVE mg/dL
Leukocytes,Ua: NEGATIVE
Nitrite: NEGATIVE
Protein, ur: NEGATIVE mg/dL
Specific Gravity, Urine: 1.024 (ref 1.005–1.030)
pH: 5 (ref 5.0–8.0)

## 2019-05-08 LAB — CBC
HCT: 45.4 % (ref 36.0–46.0)
Hemoglobin: 14.3 g/dL (ref 12.0–15.0)
MCH: 28.2 pg (ref 26.0–34.0)
MCHC: 31.5 g/dL (ref 30.0–36.0)
MCV: 89.5 fL (ref 80.0–100.0)
Platelets: 258 10*3/uL (ref 150–400)
RBC: 5.07 MIL/uL (ref 3.87–5.11)
RDW: 13.5 % (ref 11.5–15.5)
WBC: 12.5 10*3/uL — ABNORMAL HIGH (ref 4.0–10.5)
nRBC: 0 % (ref 0.0–0.2)

## 2019-05-08 LAB — COMPREHENSIVE METABOLIC PANEL
ALT: 12 U/L (ref 0–44)
AST: 16 U/L (ref 15–41)
Albumin: 3.9 g/dL (ref 3.5–5.0)
Alkaline Phosphatase: 49 U/L (ref 38–126)
Anion gap: 10 (ref 5–15)
BUN: 11 mg/dL (ref 6–20)
CO2: 22 mmol/L (ref 22–32)
Calcium: 9.2 mg/dL (ref 8.9–10.3)
Chloride: 103 mmol/L (ref 98–111)
Creatinine, Ser: 0.79 mg/dL (ref 0.44–1.00)
GFR calc Af Amer: >60 mL/min (ref >60)
GFR calc non Af Amer: >60 mL/min (ref >60)
Glucose, Bld: 90 mg/dL (ref 70–99)
Potassium: 4.4 mmol/L (ref 3.5–5.1)
Sodium: 135 mmol/L (ref 135–145)
Total Bilirubin: 0.7 mg/dL (ref 0.3–1.2)
Total Protein: 7.2 g/dL (ref 6.5–8.1)

## 2019-05-08 LAB — I-STAT BETA HCG BLOOD, ED (MC, WL, AP ONLY): I-stat hCG, quantitative: 733.1 m[IU]/mL — ABNORMAL HIGH (ref <5)

## 2019-05-08 LAB — LIPASE, BLOOD: Lipase: 19 U/L (ref 11–51)

## 2019-05-08 MED ORDER — SODIUM CHLORIDE 0.9% FLUSH: 3.0000 mL | Freq: Once | INTRAVENOUS | Status: DC

## 2019-05-08 NOTE — ED Triage Notes (Signed)
Pt c/o abdominal cramping x 2 days, nausea that started today and spotting on Saturday.

## 2019-05-08 NOTE — ED Notes (Signed)
Pt stated she wasn't waiting anymore and left

## 2019-05-09 ENCOUNTER — Inpatient Hospital Stay (HOSPITAL_COMMUNITY): Payer: Medicaid Other

## 2019-05-09 ENCOUNTER — Encounter (HOSPITAL_COMMUNITY): Payer: Self-pay | Admitting: *Deleted

## 2019-05-09 ENCOUNTER — Inpatient Hospital Stay (HOSPITAL_COMMUNITY)
Admission: AD | Admit: 2019-05-09 | Discharge: 2019-05-09 | Disposition: A | Discharge status: Home / Self Care | Payer: Medicaid Other | Attending: Obstetrics and Gynecology | Admitting: Obstetrics and Gynecology

## 2019-05-09 DIAGNOSIS — O26899 Other specified pregnancy related conditions, unspecified trimester: Secondary | ICD-10-CM

## 2019-05-09 DIAGNOSIS — Z87891 Personal history of nicotine dependence: Secondary | ICD-10-CM | POA: Diagnosis not present

## 2019-05-09 DIAGNOSIS — R102 Pelvic and perineal pain unspecified side: Secondary | ICD-10-CM

## 2019-05-09 DIAGNOSIS — O3680X0 Pregnancy with inconclusive fetal viability, not applicable or unspecified: Secondary | ICD-10-CM

## 2019-05-09 DIAGNOSIS — O26891 Other specified pregnancy related conditions, first trimester: Secondary | ICD-10-CM

## 2019-05-09 DIAGNOSIS — Z3A01 Less than 8 weeks gestation of pregnancy: Secondary | ICD-10-CM

## 2019-05-09 DIAGNOSIS — Z79899 Other long term (current) drug therapy: Secondary | ICD-10-CM | POA: Insufficient documentation

## 2019-05-09 LAB — WET PREP, GENITAL
Clue Cells Wet Prep HPF POC: NONE SEEN
Sperm: NONE SEEN
Trich, Wet Prep: NONE SEEN
Yeast Wet Prep HPF POC: NONE SEEN
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
## 2019-05-09 NOTE — Discharge Instructions (Signed)
Abdominal Pain During Pregnancy  Belly (abdominal) pain is common during pregnancy. There are many possible causes. Most of the time, it is not a serious problem. Other times, it can be a sign that something is wrong with the pregnancy. Always tell your doctor if you have belly pain. Follow these instructions at home:  Do not have sex or put anything in your vagina until your pain goes away completely.  Get plenty of rest until your pain gets better.  Drink enough fluid to keep your pee (urine) pale yellow.  Take over-the-counter and prescription medicines only as told by your doctor.  Keep all follow-up visits as told by your doctor. This is important. Contact a doctor if:  Your pain continues or gets worse after resting.  You have lower belly pain that: ? Comes and goes at regular times. ? Spreads to your back. ? Feels like menstrual cramps.  You have pain or burning when you pee (urinate). Get help right away if:  You have a fever or chills.  You have vaginal bleeding.  You are leaking fluid from your vagina.  You are passing tissue from your vagina.  You throw up (vomit) for more than 24 hours.  You have watery poop (diarrhea) for more than 24 hours.  Your baby is moving less than usual.  You feel very weak or faint.  You have shortness of breath.  You have very bad pain in your upper belly. Summary  Belly (abdominal) pain is common during pregnancy. There are many possible causes.  If you have belly pain during pregnancy, tell your doctor right away.  Keep all follow-up visits as told by your doctor. This is important. This information is not intended to replace advice given to you by your health care provider. Make sure you discuss any questions you have with your health care provider. Document Revised: 04/08/2018 Document Reviewed: 03/23/2016 Elsevier Patient Education  2020 Elsevier Inc.  

## 2019-05-11 ENCOUNTER — Other Ambulatory Visit: Payer: Self-pay

## 2019-05-11 ENCOUNTER — Inpatient Hospital Stay (HOSPITAL_COMMUNITY)
Admission: AD | Admit: 2019-05-11 | Discharge: 2019-05-11 | Disposition: A | Discharge status: Home / Self Care | Payer: Medicaid Other | Attending: Obstetrics and Gynecology | Admitting: Obstetrics and Gynecology

## 2019-05-11 DIAGNOSIS — R109 Unspecified abdominal pain: Secondary | ICD-10-CM | POA: Insufficient documentation

## 2019-05-11 DIAGNOSIS — Z3A01 Less than 8 weeks gestation of pregnancy: Secondary | ICD-10-CM | POA: Diagnosis not present

## 2019-05-11 DIAGNOSIS — O26891 Other specified pregnancy related conditions, first trimester: Secondary | ICD-10-CM

## 2019-05-11 DIAGNOSIS — O3680X0 Pregnancy with inconclusive fetal viability, not applicable or unspecified: Secondary | ICD-10-CM

## 2019-05-11 LAB — HCG, QUANTITATIVE, PREGNANCY: hCG, Beta Chain, Quant, S: 2266 m[IU]/mL — ABNORMAL HIGH (ref <5)

## 2019-05-11 NOTE — MAU Provider Note (Signed)
S: 28 y.o. G2P1001 @[redacted]w[redacted]d  by LMP presents to MAU for repeat hcg.  She denies abdominal pain or vaginal bleeding today.    Her quant hcg (istat) on 05/08/19 was 733.1 and ultrasound showed no evidence of IUP or etopic.  HPI  O: BP 136/86 (BP Location: Right Arm)   Pulse 70   Temp 98.3 F (36.8 C) (Oral)   Resp 18   LMP 04/03/2019 (Exact Date)   VS reviewed, nursing note reviewed,  Constitutional: well developed, well nourished, no distress HEENT: normocephalic CV: normal rate Pulm/chest wall: normal effort Abdomen: soft Neuro: alert and oriented x 3 Skin: warm, dry Psych: affect normal  Results for orders placed or performed during the hospital encounter of 05/11/19 (from the past 24 hour(s))  hCG, quantitative, pregnancy     Status: Abnormal   Collection Time: 05/11/19  7:08 AM  Result Value Ref Range   hCG, Beta Chain, Quant, S 2,266 (H) <5 mIU/mL       MDM: Ordered labs/reviewed results.  Quant hcg rose appropriately.  Discussed results with pt.  Pt to follow up with ultrasound in 1 week. 07/11/19 ordered at [facility].  Ectopic precautions given and pt to return to MAU sooner if s/sx of ectopic, as ruptured ectopic can be life threatening.  Pt stable at time of discharge.  A: 1. Pregnancy of unknown anatomic location   2. Abdominal pain during pregnancy in first trimester     P: D/C home with ectopic/bleeding precautions F/U with outpatient ultrasound as ordered Return to MAU as needed for emergencies  LEFTWICH-KIRBY, Deshawn Witty, CNM 2:18 PM

## 2019-05-11 NOTE — MAU Note (Signed)
Pt reports to MAU stating she is her for her f/u lab draw. Pt denies any further complications at this time.

## 2019-05-12 LAB — GC/CHLAMYDIA PROBE AMP (~~LOC~~) NOT AT ARMC
Chlamydia: NEGATIVE
Comment: NEGATIVE
Comment: NORMAL
Neisseria Gonorrhea: NEGATIVE

## 2019-05-21 ENCOUNTER — Other Ambulatory Visit: Payer: Self-pay

## 2019-05-21 ENCOUNTER — Ambulatory Visit (INDEPENDENT_AMBULATORY_CARE_PROVIDER_SITE_OTHER): Payer: Self-pay

## 2019-05-21 ENCOUNTER — Ambulatory Visit
Admission: RE | Admit: 2019-05-21 | Discharge: 2019-05-21 | Disposition: A | Discharge status: Home / Self Care | Payer: Medicaid Other | Source: Ambulatory Visit | Attending: Advanced Practice Midwife | Admitting: Advanced Practice Midwife

## 2019-05-21 DIAGNOSIS — O3680X0 Pregnancy with inconclusive fetal viability, not applicable or unspecified: Secondary | ICD-10-CM

## 2019-05-21 DIAGNOSIS — O26891 Other specified pregnancy related conditions, first trimester: Secondary | ICD-10-CM

## 2019-05-21 DIAGNOSIS — R109 Unspecified abdominal pain: Secondary | ICD-10-CM | POA: Diagnosis present

## 2019-05-21 NOTE — Progress Notes (Signed)
Pt here for results of [place] for pregnancy of unknown location. Reviewed with Debroah Loop, MD who states this US shows pregnancy in uterus, which rules out ectopic pregnancy. Debroah Loop, MD states this [place] is not definitive for a viable pregnancy. Recommends pt have follow up US in 10-14 days. Provider recommendation shared with pt. [place] ordered, front office to schedule. Pt verbalizes understanding.  Fleet Contras RN 05/21/19

## 2019-05-23 NOTE — Progress Notes (Signed)
Patient ID: Patty Smith, female   DOB: 1990-09-15, 29 y.o.   MRN: [id number] Patient was assessed and managed by nursing staff during this encounter. I have reviewed the chart and agree with the documentation and plan. I have also made any necessary editorial changes.  Scheryl Darter, MD 05/23/2019 11:13 AM

## 2019-05-29 ENCOUNTER — Ambulatory Visit: Payer: Medicaid Other

## 2019-05-29 ENCOUNTER — Ambulatory Visit
Admission: RE | Admit: 2019-05-29 | Discharge: 2019-05-29 | Disposition: A | Discharge status: Home / Self Care | Payer: Medicaid Other | Source: Ambulatory Visit | Attending: Obstetrics & Gynecology | Admitting: Obstetrics & Gynecology

## 2019-05-29 ENCOUNTER — Other Ambulatory Visit: Payer: Self-pay

## 2019-05-29 ENCOUNTER — Encounter: Payer: Self-pay | Admitting: Family Medicine

## 2019-05-29 ENCOUNTER — Ambulatory Visit (INDEPENDENT_AMBULATORY_CARE_PROVIDER_SITE_OTHER): Payer: Self-pay

## 2019-05-29 DIAGNOSIS — Z658 Other specified problems related to psychosocial circumstances: Secondary | ICD-10-CM

## 2019-05-29 DIAGNOSIS — O3680X0 Pregnancy with inconclusive fetal viability, not applicable or unspecified: Secondary | ICD-10-CM | POA: Diagnosis present

## 2019-05-29 DIAGNOSIS — Z3A01 Less than 8 weeks gestation of pregnancy: Secondary | ICD-10-CM

## 2019-05-29 DIAGNOSIS — Z3481 Encounter for supervision of other normal pregnancy, first trimester: Secondary | ICD-10-CM

## 2019-05-29 NOTE — Progress Notes (Signed)
Pt here following [place] for viability. Results reviewed with Debroah Loop, MD who finds single-living, IUP and recommends patient begin prenatal care. Pt given photos of [place], results, and provider recommendation. Reviewed patient's medications and allergies; list of medications safe to take during pregnancy given. Encouraged pt to begin taking a prenatal vitamin.  Pt states she plans to receive prenatal care elsewhere once her pregnancy Medicaid has been verified. Explained the front office will provide proof of pregnancy letter for insurance purposes. Dating reviewed with pt; pt is 7w today with EDD of 01/15/20. Pt reports continued nausea. Recommended pt try otc Unisom and B6 until she has established care with a provider. Pt encouraged to call early next week if there is no improvement in nausea.    Pt states she has quit smoking tobacco products and marijuana since finding out she is pregnant. Would like continued support in cessation, states her anxiety has increased since stopping. Referral placed for [facility] for follow up. Pt is agreeable to utilizing [facility] services. Educated pt about Quitline and how to get connected with resources.   Fleet Contras RN 05/29/19

## 2019-06-05 ENCOUNTER — Inpatient Hospital Stay (HOSPITAL_COMMUNITY)
Admission: AD | Admit: 2019-06-05 | Discharge: 2019-06-05 | Disposition: A | Discharge status: Home / Self Care | Payer: Medicaid Other | Attending: Obstetrics & Gynecology | Admitting: Obstetrics & Gynecology

## 2019-06-05 ENCOUNTER — Other Ambulatory Visit: Payer: Self-pay

## 2019-06-05 ENCOUNTER — Encounter (HOSPITAL_COMMUNITY): Payer: Self-pay | Admitting: Obstetrics & Gynecology

## 2019-06-05 DIAGNOSIS — O219 Vomiting of pregnancy, unspecified: Secondary | ICD-10-CM | POA: Diagnosis not present

## 2019-06-05 DIAGNOSIS — Z3A09 9 weeks gestation of pregnancy: Secondary | ICD-10-CM

## 2019-06-05 DIAGNOSIS — Z79899 Other long term (current) drug therapy: Secondary | ICD-10-CM | POA: Insufficient documentation

## 2019-06-05 DIAGNOSIS — O98811 Other maternal infectious and parasitic diseases complicating pregnancy, first trimester: Secondary | ICD-10-CM | POA: Insufficient documentation

## 2019-06-05 DIAGNOSIS — O209 Hemorrhage in early pregnancy, unspecified: Secondary | ICD-10-CM

## 2019-06-05 DIAGNOSIS — O99891 Other specified diseases and conditions complicating pregnancy: Secondary | ICD-10-CM

## 2019-06-05 DIAGNOSIS — O26891 Other specified pregnancy related conditions, first trimester: Secondary | ICD-10-CM | POA: Insufficient documentation

## 2019-06-05 DIAGNOSIS — B373 Candidiasis of vulva and vagina: Secondary | ICD-10-CM | POA: Diagnosis not present

## 2019-06-05 DIAGNOSIS — R109 Unspecified abdominal pain: Secondary | ICD-10-CM | POA: Diagnosis not present

## 2019-06-05 DIAGNOSIS — B3731 Acute candidiasis of vulva and vagina: Secondary | ICD-10-CM

## 2019-06-05 DIAGNOSIS — Z87891 Personal history of nicotine dependence: Secondary | ICD-10-CM | POA: Insufficient documentation

## 2019-06-05 DIAGNOSIS — O4691 Antepartum hemorrhage, unspecified, first trimester: Secondary | ICD-10-CM

## 2019-06-05 LAB — URINALYSIS, ROUTINE W REFLEX MICROSCOPIC
Bilirubin Urine: NEGATIVE
Glucose, UA: NEGATIVE mg/dL
Hgb urine dipstick: NEGATIVE
Ketones, ur: NEGATIVE mg/dL
Leukocytes,Ua: NEGATIVE
Nitrite: NEGATIVE
Protein, ur: NEGATIVE mg/dL
Specific Gravity, Urine: 1.014 (ref 1.005–1.030)
pH: 7 (ref 5.0–8.0)

## 2019-06-05 LAB — WET PREP, GENITAL
Clue Cells Wet Prep HPF POC: NONE SEEN
Sperm: NONE SEEN
Trich, Wet Prep: NONE SEEN

## 2019-06-05 MED ORDER — POLYETHYLENE GLYCOL 3350 17 GM/SCOOP PO POWD
17.0000 g | Freq: Every day | ORAL | 1 refills | Status: DC | PRN
Start: 2019-06-05 — End: 2019-11-20

## 2019-06-05 MED ORDER — PROMETHAZINE HCL 12.5 MG PO TABS
12.5000 mg | ORAL_TABLET | Freq: Four times a day (QID) | ORAL | 0 refills | Status: DC | PRN
Start: 2019-06-05 — End: 2019-12-29

## 2019-06-05 MED ORDER — CLOTRIMAZOLE 1 % VA CREA: 1.0000 | TOPICAL_CREAM | Freq: Every day | VAGINAL | 0 refills | Status: AC

## 2019-06-05 NOTE — BH Specialist Note (Signed)
Integrated Behavioral Health Initial Visit  MRN: [id number] Name: Patty Smith  Number of Integrated Behavioral Health Clinician visits:: 1/6 Session Start time: 9:24  Session End time: 10:21 Total time: 80  Type of Service: Integrated Behavioral Health- Individual/Family Interpretor:Yes.   Interpretor Name and Language: n/a   Warm Hand Off Completed.       SUBJECTIVE: Patty Smith is a 29 y.o. female accompanied by n/a Patient was referred by Scheryl Darter, MD for psychosocial stress. Patient reports the following symptoms/concerns: Pt states her primary symptoms are difficulty falling asleep, poor appetite, anxiety, worry, and dread, attributed to past trauma and current job loss in pregnancy; has coped in the past with marijuana, and prefers learning self-coping strategy today. Pt states passive SI, with no intent, no plan.  Duration of problem: Increase in current pregnancy with recent job loss.  Severity of problem: moderately severe  OBJECTIVE: Mood: Normal and Affect: Appropriate Risk of harm to self or others: No plan to harm self or others  LIFE CONTEXT: Family and Social: Pt lives with FOB and 6yo son School/Work: Recent job loss Self-Care: Recognizing a greater need for self-care Life Changes: Current pregnancy and recent job loss  GOALS ADDRESSED: Patient will: 1. Reduce symptoms of: anxiety, depression and stress 2. Increase knowledge and/or ability of: healthy habits and self-management skills  3. Demonstrate ability to: Increase healthy adjustment to current life circumstances and Decrease self-medicating behaviors  INTERVENTIONS: Interventions utilized: Mindfulness or Management consultant and Psychoeducation and/or Health Education  Standardized Assessments completed: GAD-7 and PHQ 9  ASSESSMENT: Patient currently experiencing Adjustment disorder with mixed anxiety and depressed mood and Psychosocial stress.   Patient may benefit from psychoeducation and brief  therapeutic interventions regarding coping with symptoms of depression, anxiety and life stress .  PLAN: 1. Follow up with behavioral health clinician on : Two weeks 2. Behavioral recommendations:  -CALM relaxation breathing exercise twice daily (morning; at bedtime with sleep sounds) -Continue taking prenatal vitamin  And anti-nausea medication, as prescribed by medical provider -Sign [id number] consent  -Accept referral to [facility] for additional support 3. Referral(s): Integrated [employer] [facility] (In Clinic)  [place]etta [place], [place]  Depression screen [facility] 2/9 10/31/2017  Decreased Interest 1  Down, Depressed, Hopeless 1  PHQ - 2 Score 2  Altered sleeping 1  Tired, decreased energy 1  Change in appetite 0  Feeling bad or failure about yourself  1  Trouble concentrating 0  Moving slowly or fidgety/restless 0  Suicidal thoughts 0  PHQ-9 Score 5   GAD 7 : Generalized Anxiety Score 10/31/2017  Nervous, Anxious, on Edge 1  Control/stop worrying 0  Worry too much - different things 0  Trouble relaxing 1  Restless 0  Easily annoyed or irritable 1  Afraid - awful might happen 0  Total GAD 7 Score 3

## 2019-06-05 NOTE — Discharge Instructions (Signed)
Vaginal Bleeding During Pregnancy, First Trimester  A small amount of bleeding from the vagina (spotting) is relatively common during early pregnancy. It usually stops on its own. Various things may cause bleeding or spotting during early pregnancy. Some bleeding may be related to the pregnancy, and some may not. In many cases, the bleeding is normal and is not a problem. However, bleeding can also be a sign of something serious. Be sure to tell your health care provider about any vaginal bleeding right away. Some possible causes of vaginal bleeding during the first trimester include:  Infection or inflammation of the cervix.  Growths (polyps) on the cervix.  Miscarriage or threatened miscarriage.  Pregnancy tissue developing outside of the uterus (ectopic pregnancy).  A mass of tissue developing in the uterus due to an egg being fertilized incorrectly (molar pregnancy). Follow these instructions at home: Activity  Follow instructions from your health care provider about limiting your activity. Ask what activities are safe for you.  If needed, make plans for someone to help with your regular activities.  Do not have sex or orgasms until your health care provider says that this is safe. General instructions  Take over-the-counter and prescription medicines only as told by your health care provider.  Pay attention to any changes in your symptoms.  Do not use tampons or douche.  Write down how many pads you use each day, how often you change pads, and how soaked (saturated) they are.  If you pass any tissue from your vagina, save the tissue so you can show it to your health care provider.  Keep all follow-up visits as told by your health care provider. This is important. Contact a health care provider if:  You have vaginal bleeding during any part of your pregnancy.  You have cramps or labor pains.  You have a fever. Get help right away if:  You have severe cramps in your  back or abdomen.  You pass large clots or a large amount of tissue from your vagina.  Your bleeding increases.  You feel light-headed or weak, or you faint.  You have chills.  You are leaking fluid or have a gush of fluid from your vagina. Summary  A small amount of bleeding (spotting) from the vagina is relatively common during early pregnancy.  Various things may cause bleeding or spotting in early pregnancy.  Be sure to tell your health care provider about any vaginal bleeding right away. This information is not intended to replace advice given to you by your health care provider. Make sure you discuss any questions you have with your health care provider. Document Revised: 04/09/2018 Document Reviewed: 03/23/2016 Elsevier Patient Education  2020 Elsevier Inc.  

## 2019-06-05 NOTE — MAU Note (Signed)
Pink spotting for 3 days. Some mild cramping in mid abdomen on the sides

## 2019-06-05 NOTE — MAU Provider Note (Signed)
History     [id number]  Arrival date and time: 06/05/19 2035    Chief Complaint  Patient presents with  . Vaginal Bleeding     HPI Patty Smith is a 29 y.o. at [redacted]w[redacted]d by 7wk [place] with, who presents for abdominal cramping and some spotting.   Seen in MAU in early May 2021 for workup of pregnancy of unknown location Ultimately had appropriate rise in HCG with viable IUP diagnosed on 05/29/2019  Today reports spotting continuously since Monday Thinks there is some association with delayed eating Only notices it with peeing and wiping Didn't have today but given that happened for three days wanted to be evaluated No BM in two days Has had some cramping but no pain No burning or pain with urination +nausea/vomiting, not taking anything      OB History    Gravida  2   Para  1   Term  1   Preterm      AB      Living  1     SAB      TAB      Ectopic      Multiple      Live Births  1           Past Medical History:  Diagnosis Date  . Scarlet fever 2001  . Viral meningitis     Past Surgical History:  Procedure Laterality Date  . CESAREAN SECTION      Family History  Problem Relation Age of Onset  . Heart failure Father   . Hypertension Father   . Diabetes Father   . Endometriosis Mother   . Depression Mother   . COPD Mother     Social History   Socioeconomic History  . Marital status: Divorced    Spouse name: Not on file  . Number of children: Not on file  . Years of education: Not on file  . Highest education level: Not on file  Occupational History  . Not on file  Tobacco Use  . Smoking status: Former Smoker    Packs/day: 0.30    Types: Cigarettes    Quit date: 05/05/2019    Years since quitting: 0.0  . Smokeless tobacco: Never Used  Substance and Sexual Activity  . Alcohol use: Not Currently    Comment: Occ  . Drug use: Not Currently    Types: Marijuana    Comment: last use was 05/05/2019  . Sexual activity: Yes    Birth  control/protection: None  Other Topics Concern  . Not on file  Social History Narrative  . Not on file   Social Determinants of Health   Financial Resource Strain:   . Difficulty of Paying Living Expenses:   Food Insecurity:   . Worried About Programme researcher, broadcasting/film/video in the Last Year:   . Barista in the Last Year:   Transportation Needs:   . Freight forwarder (Medical):   Marland Kitchen Lack of Transportation (Non-Medical):   Physical Activity:   . Days of Exercise per Week:   . Minutes of Exercise per Session:   Stress:   . Feeling of Stress :   Social Connections:   . Frequency of Communication with Friends and Family:   . Frequency of Social Gatherings with Friends and Family:   . Attends Religious Services:   . Active Member of Clubs or Organizations:   . Attends Banker Meetings:   Marland Kitchen Marital Status:  Intimate Partner Violence:   . Fear of Current or Ex-Partner:   . Emotionally Abused:   Marland Kitchen Physically Abused:   . Sexually Abused:     Allergies  Allergen Reactions  . Sulfur     Unknown rxn    No current facility-administered medications on file prior to encounter.   Current Outpatient Medications on File Prior to Encounter  Medication Sig Dispense Refill  . acetaminophen (TYLENOL) 500 MG tablet Take 2 tablets (1,000 mg total) by mouth every 8 (eight) hours as needed. 30 tablet 0  . metoCLOPramide (REGLAN) 10 MG tablet Take 1 tablet (10 mg total) by mouth every 6 (six) hours. (Patient not taking: Reported on 05/29/2019) 20 tablet 0  . ondansetron (ZOFRAN) 4 MG tablet Take 1 tablet (4 mg total) by mouth every 8 (eight) hours as needed for nausea or vomiting. (Patient not taking: Reported on 05/29/2019) 16 tablet 0     ROS Pertinent positives and negative per HPI, all others reviewed and negative  Physical Exam   BP 134/84   Pulse 100   Temp 98.8 F (37.1 C)   Resp 18   Ht 5\' 5"  (1.651 m)   Wt 93 kg   LMP 04/03/2019 (Exact Date)   BMI 34.11 kg/m    Physical Exam  Vitals reviewed. Constitutional: She appears well-developed and well-nourished. No distress.  Eyes: No scleral icterus.  Respiratory: Effort normal. No respiratory distress.  GI: Soft. She exhibits no distension. There is no abdominal tenderness. There is no rebound and no guarding.  Genitourinary:    Genitourinary Comments: Scant brown blood in vaginal vault. Cervix closed and long visually   Musculoskeletal:        General: No edema.  Neurological: She is alert. Coordination normal.  Skin: Skin is warm and dry. She is not diaphoretic.  Psychiatric: She has a normal mood and affect.    Cervical Exam    Bedside Ultrasound Pt informed that the ultrasound is considered a limited OB ultrasound and is not intended to be a complete ultrasound exam.  Patient also informed that the ultrasound is not being completed with the intent of assessing for fetal or placental anomalies or any pelvic abnormalities.  Explained that the purpose of today's ultrasound is to assess for  viability.  Patient acknowledges the purpose of the exam and the limitations of the study.    IUP visualized with HR at 162 bpm   Labs Results for orders placed or performed during the hospital encounter of 06/05/19 (from the past 24 hour(s))  Urinalysis, Routine w reflex microscopic     Status: Abnormal   Collection Time: 06/05/19  9:08 PM  Result Value Ref Range   Color, Urine YELLOW YELLOW   APPearance CLOUDY (A) CLEAR   Specific Gravity, Urine 1.014 1.005 - 1.030   pH 7.0 5.0 - 8.0   Glucose, UA NEGATIVE NEGATIVE mg/dL   Hgb urine dipstick NEGATIVE NEGATIVE   Bilirubin Urine NEGATIVE NEGATIVE   Ketones, ur NEGATIVE NEGATIVE mg/dL   Protein, ur NEGATIVE NEGATIVE mg/dL   Nitrite NEGATIVE NEGATIVE   Leukocytes,Ua NEGATIVE NEGATIVE  Wet prep, genital     Status: Abnormal   Collection Time: 06/05/19 10:29 PM  Result Value Ref Range   Yeast Wet Prep HPF POC PRESENT (A) NONE SEEN   Trich, Wet Prep  NONE SEEN NONE SEEN   Clue Cells Wet Prep HPF POC NONE SEEN NONE SEEN   WBC, Wet Prep HPF POC FEW (A) NONE SEEN  Sperm NONE SEEN     Imaging No results found.  MAU Course  Procedures  Lab Orders     Wet prep, genital     Urinalysis, Routine w reflex microscopic Meds ordered this encounter  Medications  . polyethylene glycol powder (GLYCOLAX/MIRALAX) 17 GM/SCOOP powder    Sig: Take 17 g by mouth daily as needed.    Dispense:  510 g    Refill:  1  . promethazine (PHENERGAN) 12.5 MG tablet    Sig: Take 1 tablet (12.5 mg total) by mouth every 6 (six) hours as needed for nausea or vomiting.    Dispense:  30 tablet    Refill:  0  . clotrimazole (GYNE-LOTRIMIN) 1 % vaginal cream    Sig: Place 1 Applicatorful vaginally at bedtime for 7 days.    Dispense:  45 g    Refill:  0   Imaging Orders  No imaging studies ordered today    MDM moderate  Assessment and Plan  #Vaginal bleeding in 1st trimester Patient with some spotting earlier this week now resolved, cervix long and closed on speculum exam and bedside ultrasound shows IUP with normal HR. Reassured patient of findings, ddx includes subchorionic hemorrhage but management would not change regardless with formal US and prognosis good with IUP w normal heart rate. Requested rx fo miralax and phenergan, given at d/c. Reviewed warning signs, d/c to home in stable condition.   #Yeast vaginitis Wet prep only positive for yeast. Possible contributor though less likely, rx sent for topical treatment.    Venora Maples

## 2019-06-06 LAB — GC/CHLAMYDIA PROBE AMP (~~LOC~~) NOT AT ARMC
Chlamydia: NEGATIVE
Comment: NEGATIVE
Comment: NORMAL
Neisseria Gonorrhea: NEGATIVE

## 2019-06-09 ENCOUNTER — Other Ambulatory Visit: Payer: Self-pay

## 2019-06-09 ENCOUNTER — Ambulatory Visit (INDEPENDENT_AMBULATORY_CARE_PROVIDER_SITE_OTHER): Payer: Medicaid Other | Admitting: Clinical

## 2019-06-09 DIAGNOSIS — Z658 Other specified problems related to psychosocial circumstances: Secondary | ICD-10-CM | POA: Diagnosis not present

## 2019-06-09 DIAGNOSIS — F4323 Adjustment disorder with mixed anxiety and depressed mood: Secondary | ICD-10-CM

## 2019-06-09 NOTE — Addendum Note (Signed)
Addended by: Hulda Marin C on: 06/09/2019 02:03 PM   Modules accepted: Level of Service

## 2019-06-09 NOTE — Patient Instructions (Addendum)
[email or web address]  Teacher, adult education)     /Emotional Producer, television/film/video and Websites Here are a few free apps meant to help you to help yourself.  To find, try searching on the internet to see if the app is offered on Apple/Android devices. If your first choice doesn't come up on your device, the good news is that there are many choices! Play around with different apps to see which ones are helpful to you.    Calm This is an app meant to help increase calm feelings. Includes info, strategies, and tools for tracking your feelings.      Calm Harm  This app is meant to help with self-harm. Provides many 5-minute or 15-min coping strategies for doing instead of hurting yourself.       Healthy Minds Health Minds is a problem-solving tool to help deal with emotions and cope with stress you encounter wherever you are.      MindShift This app can help people cope with anxiety. Rather than trying to avoid anxiety, you can make an important shift and face it.      MY3  MY3 features a support system, safety plan and resources with the goal of offering a tool to use in a time of need.       My Life My Voice  This mood journal offers a simple solution for tracking your thoughts, feelings and moods. Animated emoticons can help identify your mood.       Relax Melodies Designed to help with sleep, on this app you can mix sounds and meditations for relaxation.      Smiling Mind Smiling Mind is meditation made easy: it's a simple tool that helps put a smile on your mind.        Stop, Breathe & Think  A friendly, simple guide for people through meditations for mindfulness and compassion.  Stop, Breathe and Think Kids Enter your current feelings and choose a "mission" to help you cope. Offers videos for certain moods instead of just sound recordings.       Team Orange The goal of this tool is to help teens change how they think, act, and react. This app helps you focus on  your own good feelings and experiences.      The United Stationers Box The United Stationers Box (VHB) contains simple tools to help patients with coping, relaxation, distraction, and positive thinking.    Behavioral Health Resources:   What if I or someone I know is in crisis?  . If you are thinking about harming yourself or having thoughts of suicide, or if you know someone who is, seek help right away.  . Call your doctor or mental health care provider.  . Call 911 or go to a hospital emergency room to get immediate help, or ask a friend or family member to help you do these things.  . Call the [place] National Suicide Prevention Lifeline's toll-free, 24-hour hotline at 1-800-273-TALK 973-287-8240) or TTY: 1-800-799-4 TTY (312)160-2509) to talk to a trained counselor.  . If you are in crisis, make sure you are not left alone.   . If someone else is in crisis, make sure he or she is not left alone   24 Hour :   [place] National Suicide Hotline: 973-388-6769  Therapeutic Alternative Mobile Crisis: 318-394-4701   [facility]  [address] [place], [place] [address]  [phone] or [phone]  Family Service of the [employer] (Domestic Violence, Rape & Victim  Assistance)  [place], [place]  [address]   [phone] or (732) [phone]   [place]: [phone] (8am-4pm) or [phone] (after hours)           [facility], [address] [place], [place] Fax: [phone] www.[email or web address].[email or web address]  *Interpreters available *Accepts Medicaid, Medicare, uninsured  [facility]   Mon-Fri: 8am-5pm [address], [place], [place] [phone](phone); [phone]) BloggerCourse.com  *Accepts Medicare  [facility] [place], Fri: 8am-4pm [address], [place], [place] (phone); [phone] (fax) TaskTown.es  *[place] Mon-Fri: 9am-5pm  [address] [place], [place] (phone); [phone]  [email or web address]  *Accepts Medicaid  [facility] [address], [place], [place]  SalonLookup.es   [facility] of the [place], 8:30am-12pm/1pm-2:30pm [address] [place], [place] (phone); [phone] (fax) www.[email or web address]  *Accepts Medicaid, sliding-scale*Bilingual services available  [facility] Mon-Fri, 8am-7pm [place], [place]  [phone](phone); ([phone]) www.[email or web address]  *Accepts Medicaid *Bilingual services available  [facility] Mon-Fri: 8am-5pm, Saturday by appointment only [place], [place], [place] (phone); [phone] (fax) [email or web address]   [facility] [address] [place], [place], [place] [email or web address]  *Free & reduced services for uninsured and underinsured individuals *Bilingual services for Spanish-speaking clients 21 and under  [facility], [address], [place], [place]); ([phone]) RunningConvention.de  *Bring your own interpreter at first visit *Accepts Medicare and [facility]  [place] Mon-Fri: 9am-5:30pm [address], [place], [place], [place] (phone), [phone] (fax) After hours crisis line: 3035316752 www.[email or web address]  *Accepts Medicare and Medicaid  [facility], 8am-6pm [address] [place], [place] (phone);  [phone] (fax) http://[email or web address]  *Subsidized costs available  [facility] Mon-Fri: 8am-4pm [address] [place], [place] [phone](phone); ([phone]) www.p[email or web address]  *Accepts Medicaid  [facility] Same day access hours: Mon-Fri, 8:30-3pm Crisis hours: Mon-Fri, 8am-5pm [place], [place] Same day access hours: Mon-Fri, 8:30-3pm Crisis hours: Mon-Fri, 8am-8pm [address], [place], [place] (phone); [phone] (fax) www.[email or web address]  *Accepts Medicaid and Medicare  [facility], [place], Fri: 9am-9pm Tues, Thurs: 9am-6pm [place], [place], [place] (phone); [phone] (fax) https://[email or web address]  *(Accepts Medicare and Medicaid; payment plans available)*Bilingual services available  [facility] [address] [place], [place] (phone); [phone] (fax) [email or web address] [address], [place], [place], [place]  [email or web address]  *Bilingual services available  [facility] ([facility] Emotional [facility]) Mon-Thurs: 8am-8pm [address] [place], [place], [place] (phone); [phone] (fax) [email or web address]  *Accepts Medicaid*Bilingual services available  [place] [address] [place], [place], [place] (phone) DeadConnect.[email or web address].cy  *Accepts Medicaid *Bilingual services available  [facility] Mon-Fri, 9am-4:45pm [address], [place], [place] (phone); [phone] (fax) http://[email or web address]  *Accepts Medicare  [facility] Mon-Thurs: 8:30-8pm, Fri: 8:30am-7pm [address], [place], [place] (3rd floor) [phone] (phone); [phone]  (fax) VIPinterview.[email or web address]  *Accepts Medicaid; income-based reduced rates available  [facility] Mon-Fri: 8am-5pm [address], [place], [place], [place] (phone); [phone] (fax) [email or web address]://[email or web address]  *Accepts Medicaid*Bilingual services available  [place], [place], [place], [place] (phone); [phone] (fax) www.youthfocus.org  *  Free emergency housing and clinical services for youth in crisis  [facility] ([place] [employer] [place])  [address], [place] [phone] www.mhag.org  *Provides direct services to individuals in recovery from mental illness, including support groups, recovery skills classes, and one on one peer support  [employer] on Mental Illness) [place] helpline: 430-854-0469  https://namiguilford.org  *A community hub for information relating to local resources and services for the friends and families of individuals living alongside a mental health condition, as well as the individuals themselves. Classes and support groups also provided

## 2019-06-10 NOTE — Progress Notes (Signed)
Patient ID: Patty Smith, female   DOB: 08/10/1990, 29 y.o.   MRN: [id number] Patient was assessed and managed by nursing staff during this encounter. I have reviewed the chart and agree with the documentation and plan. I have also made any necessary editorial changes.  Scheryl Darter, MD 06/10/2019 7:36 PM

## 2019-06-10 NOTE — BH Specialist Note (Signed)
Integrated Behavioral Health via Telemedicine Video Visit  06/10/2019 Shann Lewellyn [id number]  Number of [place] visits: 2 Session Start time: 2:54  Session End time: 3:20 Total time: 26  Referring Provider: Emeterio Reeve, MD Type of Visit: Video Patient/Family location: Home [facility] Provider location: Center for [place] [facility]  All persons participating in visit: Patient Annetta Deiss and [place]    Confirmed patient's address: Yes  Confirmed patient's phone number: Yes  Any changes to demographics: No   Confirmed patient's insurance: Yes  Any changes to patient's insurance: No   Discussed confidentiality: at previous visit  I connected with Kris Mouton by a video enabled telemedicine application (Caregility) and verified that I am speaking with the correct person using two identifiers.     I discussed the limitations of evaluation and management by telemedicine and the availability of in person appointments.  I discussed that the purpose of this visit is to provide behavioral health care while limiting exposure to the novel coronavirus.   Discussed there is a possibility of technology failure and discussed alternative modes of communication if that failure occurs.  I discussed that engaging in this video visit, they consent to the provision of behavioral healthcare and the services will be billed under their insurance.  Patient and/or legal guardian expressed understanding and consented to video visit: Yes   PRESENTING CONCERNS: Patient and/or family reports the following symptoms/concerns: Pt states primary concern today is life stress (lost wallet, ac in car not working, unemployment, nausea etc); is using relaxation breathing exercises daily with son; feels best when she can get out of the house.  Duration of problem: Increase in current pregnancy; Severity of problem: moderate  STRENGTHS (Protective Factors/Coping  Skills): Supportive family; adhering to treatment; motivated for change  GOALS ADDRESSED: Patient will: 1.  Reduce symptoms of: anxiety, depression and stress  2.  Increase knowledge and/or ability of: stress reduction  3.  Demonstrate ability to: Increase healthy adjustment to current life circumstances  INTERVENTIONS: Interventions utilized:  Psychoeducation and/or Health Education and Link to [employer] Standardized Assessments completed: PHQ9/GAD7 given in past two weeks  ASSESSMENT: Patient currently experiencing Adjustment disorder with mixed anxiety and depressed mood and Psychosocial stress.   Patient may benefit from continued psychoeducation and brief therapeutic interventions regarding coping with symptoms of anxiety, depression, and life stress .  PLAN: 1. Follow up with behavioral health clinician on : Three weeks 2. Behavioral recommendations:  -Continue using relaxation breathing exercise twice daily with son (morning; at bedtime) -Continue taking nausea medication, as prescribed -Check voicemail; call [place] back to set up appointment -Consider community resources (on After Visit Summary) for summer activities with son, nephews and nieces -Continue with plan to talk to unemployment lawyer 3. Referral(s): [place] (In Clinic) and Community Resources:  [employer]  I discussed the assessment and treatment plan with the patient and/or parent/guardian. They were provided an opportunity to ask questions and all were answered. They agreed with the plan and demonstrated an understanding of the instructions.   They were advised to call back or seek an in-person evaluation if the symptoms worsen or if the condition fails to improve as anticipated.  Caroleen Hamman Cody Oliger  Depression screen [facility] 2/9 06/09/2019 10/31/2017  Decreased Interest 2 1  Down, Depressed, Hopeless 2 1  PHQ - 2 Score 4 2  Altered sleeping 3 1  Tired, decreased energy 2 1   Change in appetite 3 0  Feeling bad or failure about yourself  1 1  Trouble concentrating 2 0  Moving slowly or fidgety/restless 1 0  Suicidal thoughts 1 0  PHQ-9 Score 17 5   GAD 7 : Generalized Anxiety Score 06/09/2019 10/31/2017  Nervous, Anxious, on Edge 3 1  Control/stop worrying 3 0  Worry too much - different things 3 0  Trouble relaxing 2 1  Restless 2 0  Easily annoyed or irritable 3 1  Afraid - awful might happen 1 0  Total GAD 7 Score 17 3

## 2019-06-23 ENCOUNTER — Ambulatory Visit (INDEPENDENT_AMBULATORY_CARE_PROVIDER_SITE_OTHER): Payer: Medicaid Other | Admitting: Clinical

## 2019-06-23 DIAGNOSIS — F4323 Adjustment disorder with mixed anxiety and depressed mood: Secondary | ICD-10-CM | POA: Diagnosis not present

## 2019-06-23 DIAGNOSIS — O99340 Other mental disorders complicating pregnancy, unspecified trimester: Secondary | ICD-10-CM | POA: Diagnosis not present

## 2019-06-23 DIAGNOSIS — Z658 Other specified problems related to psychosocial circumstances: Secondary | ICD-10-CM | POA: Diagnosis not present

## 2019-06-23 NOTE — Patient Instructions (Signed)
Center for [facility] at [facility] [address] [place], [place] [address] [phone] (main office) [phone] (Samyria Rudie's office)  Summer activities with kids:   [employer]  [employer]  Links to all  found at:   [email or web address]   [email or web address]   [employer]  [email or web address]

## 2019-07-17 ENCOUNTER — Other Ambulatory Visit: Payer: Self-pay

## 2019-07-17 ENCOUNTER — Telehealth (INDEPENDENT_AMBULATORY_CARE_PROVIDER_SITE_OTHER): Payer: Medicaid Other | Admitting: *Deleted

## 2019-07-17 DIAGNOSIS — E669 Obesity, unspecified: Secondary | ICD-10-CM

## 2019-07-17 DIAGNOSIS — O99340 Other mental disorders complicating pregnancy, unspecified trimester: Secondary | ICD-10-CM

## 2019-07-17 DIAGNOSIS — O99343 Other mental disorders complicating pregnancy, third trimester: Secondary | ICD-10-CM

## 2019-07-17 DIAGNOSIS — O99210 Obesity complicating pregnancy, unspecified trimester: Secondary | ICD-10-CM | POA: Insufficient documentation

## 2019-07-17 DIAGNOSIS — Z98891 History of uterine scar from previous surgery: Secondary | ICD-10-CM

## 2019-07-17 DIAGNOSIS — F32A Depression, unspecified: Secondary | ICD-10-CM

## 2019-07-17 DIAGNOSIS — O34219 Maternal care for unspecified type scar from previous cesarean delivery: Secondary | ICD-10-CM

## 2019-07-17 DIAGNOSIS — Z3490 Encounter for supervision of normal pregnancy, unspecified, unspecified trimester: Secondary | ICD-10-CM

## 2019-07-17 DIAGNOSIS — F329 Major depressive disorder, single episode, unspecified: Secondary | ICD-10-CM

## 2019-07-17 DIAGNOSIS — O0990 Supervision of high risk pregnancy, unspecified, unspecified trimester: Secondary | ICD-10-CM | POA: Insufficient documentation

## 2019-07-17 DIAGNOSIS — F419 Anxiety disorder, unspecified: Secondary | ICD-10-CM

## 2019-07-17 MED ORDER — BLOOD PRESSURE KIT DEVI: 1.0000 | 0 refills | Status: DC | PRN

## 2019-07-17 NOTE — Progress Notes (Signed)
Dating Criteria: Methods for Estimating the Due Date

## 2019-07-17 NOTE — Progress Notes (Signed)
1:23 Patty Smith not connected virtually. I called her and confirmed 2 identifiers. I informed her I was calling re: her appointment and that we would like to do it virtually. I asked her to connect with me virtually. Candace Ramus,RN  I connected with  [name] on 07/17/19 at  1:15 PM EDT by telephone and verified that I am speaking with the correct person using two identifiers.   I discussed the limitations, risks, security and privacy concerns of performing an evaluation and management service by telephone and the availability of in person appointments. I also discussed with the patient that there may be a patient responsible charge related to this service. The patient expressed understanding and agreed to proceed.  I explained I am completing her New OB Intake today. We discussed Her EDD and that it is based on  Early [place] due to LMP dating is more than 5 days different . I reviewed her allergies, meds, OB History, Medical /Surgical history, and appropriate screenings. I informed her of [facility] services. She does have + screens and history of anxiety and depression. She already has an appointment with [facility]/ Jamie set up.    I explained we will have her take her blood pressure weekly during her pregnancy. She confirmed she does not have a bp cuff. I explained I will send a blood pressure cuff to [facility] that will fill that prescription and she will pick up the cuff.  I asked her to bring the blood pressure cuff with her to her first ob appointment so we can show her how to use it. Explained  then we will have her take her blood pressure weekly and enter into an app. I sent her [employer] App which she will download later today.   I explained she will have some visits in office and some virtually. She already has Sports coach. I reviewed her new ob  appointment date/ time with her , our location and to wear mask, no visitors.  I explained she will have a pelvic exam, ob bloodwork, hemoglobin a1C, cbg, and  genetic  testing if desired,- she does want a panorama. I will  scheduled an [place] at 19 weeks and she will see  the appointment in MyChart. She voices understanding.  [name],RN 07/17/2019  2:10pm

## 2019-07-17 NOTE — Patient Instructions (Signed)

## 2019-07-21 ENCOUNTER — Ambulatory Visit: Payer: Medicaid Other | Admitting: Clinical

## 2019-07-21 DIAGNOSIS — Z5329 Procedure and treatment not carried out because of patient's decision for other reasons: Secondary | ICD-10-CM

## 2019-07-21 DIAGNOSIS — Z91199 Patient's noncompliance with other medical treatment and regimen due to unspecified reason: Secondary | ICD-10-CM

## 2019-07-21 NOTE — BH Specialist Note (Signed)
Pt did not arrive to video visit and did not answer the phone ; Left HIPPA-compliant message to call back Asher Muir from Center for [employer] [facility] at [phone] (main office) or [phone] (Eusevio Schriver's office).  ; left MyChart message for patient.    Integrated Behavioral Health via Telemedicine Video (Caregility) Visit  07/21/2019 Patty Smith [id number]  Rae Lips

## 2019-07-22 ENCOUNTER — Other Ambulatory Visit (HOSPITAL_COMMUNITY)
Admission: RE | Admit: 2019-07-22 | Discharge: 2019-07-22 | Disposition: A | Discharge status: Home / Self Care | Payer: Medicaid Other | Source: Ambulatory Visit | Attending: Student | Admitting: Student

## 2019-07-22 ENCOUNTER — Ambulatory Visit (INDEPENDENT_AMBULATORY_CARE_PROVIDER_SITE_OTHER): Payer: Medicaid Other | Admitting: Student

## 2019-07-22 ENCOUNTER — Other Ambulatory Visit: Payer: Self-pay

## 2019-07-22 ENCOUNTER — Encounter: Payer: Self-pay | Admitting: Student

## 2019-07-22 VITALS — BP 126/75 | HR 91 | Wt 209.8 lb

## 2019-07-22 DIAGNOSIS — Z3492 Encounter for supervision of normal pregnancy, unspecified, second trimester: Secondary | ICD-10-CM

## 2019-07-22 DIAGNOSIS — Z98891 History of uterine scar from previous surgery: Secondary | ICD-10-CM

## 2019-07-22 DIAGNOSIS — Z8759 Personal history of other complications of pregnancy, childbirth and the puerperium: Secondary | ICD-10-CM | POA: Diagnosis not present

## 2019-07-22 DIAGNOSIS — Z3A14 14 weeks gestation of pregnancy: Secondary | ICD-10-CM | POA: Diagnosis not present

## 2019-07-22 NOTE — Progress Notes (Signed)
Medicaid Home Form signed-07/22/19

## 2019-07-22 NOTE — Patient Instructions (Addendum)
Second Trimester of Pregnancy  The second trimester is from week 14 through week 27 (month 4 through 6). This is often the time in pregnancy that you feel your best. Often times, morning sickness has lessened or quit. You may have more energy, and you may get hungry more often. Your unborn baby is growing rapidly. At the end of the sixth month, he or she is about 9 inches long and weighs about 1 pounds. You will likely feel the baby move between 18 and 20 weeks of pregnancy. Follow these instructions at home: Medicines  Take over-the-counter and prescription medicines only as told by your doctor. Some medicines are safe and some medicines are not safe during pregnancy.  Take a prenatal vitamin that contains at least 600 micrograms (mcg) of folic acid.  If you have trouble pooping (constipation), take medicine that will make your stool soft (stool softener) if your doctor approves. Eating and drinking   Eat regular, healthy meals.  Avoid raw meat and uncooked cheese.  If you get low calcium from the food you eat, talk to your doctor about taking a daily calcium supplement.  Avoid foods that are high in fat and sugars, such as fried and sweet foods.  If you feel sick to your stomach (nauseous) or throw up (vomit): ? Eat 4 or 5 small meals a day instead of 3 large meals. ? Try eating a few soda crackers. ? Drink liquids between meals instead of during meals.  To prevent constipation: ? Eat foods that are high in fiber, like fresh fruits and vegetables, whole grains, and beans. ? Drink enough fluids to keep your pee (urine) clear or pale yellow. Activity  Exercise only as told by your doctor. Stop exercising if you start to have cramps.  Do not exercise if it is too hot, too humid, or if you are in a place of great height (high altitude).  Avoid heavy lifting.  Wear low-heeled shoes. Sit and stand up straight.  You can continue to have sex unless your doctor tells you not  to. Relieving pain and discomfort  Wear a good support bra if your breasts are tender.  Take warm water baths (sitz baths) to soothe pain or discomfort caused by hemorrhoids. Use hemorrhoid cream if your doctor approves.  Rest with your legs raised if you have leg cramps or low back pain.  If you develop puffy, bulging veins (varicose veins) in your legs: ? Wear support hose or compression stockings as told by your doctor. ? Raise (elevate) your feet for 15 minutes, 3-4 times a day. ? Limit salt in your food. Prenatal care  Write down your questions. Take them to your prenatal visits.  Keep all your prenatal visits as told by your doctor. This is important. Safety  Wear your seat belt when driving.  Make a list of emergency phone numbers, including numbers for family, friends, the hospital, and police and fire departments. General instructions  Ask your doctor about the right foods to eat or for help finding a counselor, if you need these services.  Ask your doctor about local prenatal classes. Begin classes before month 6 of your pregnancy.  Do not use hot tubs, steam rooms, or saunas.  Do not douche or use tampons or scented sanitary pads.  Do not cross your legs for long periods of time.  Visit your dentist if you have not done so. Use a soft toothbrush to brush your teeth. Floss gently.  Avoid all smoking, herbs,   and alcohol. Avoid drugs that are not approved by your doctor.  Do not use any products that contain nicotine or tobacco, such as cigarettes and e-cigarettes. If you need help quitting, ask your doctor.  Avoid cat litter boxes and soil used by cats. These carry germs that can cause birth defects in the baby and can cause a loss of your baby (miscarriage) or stillbirth. Contact a doctor if:  You have mild cramps or pressure in your lower belly.  You have pain when you pee (urinate).  You have bad smelling fluid coming from your vagina.  You continue to  feel sick to your stomach (nauseous), throw up (vomit), or have watery poop (diarrhea).  You have a nagging pain in your belly area.  You feel dizzy. Get help right away if:  You have a fever.  You are leaking fluid from your vagina.  You have spotting or bleeding from your vagina.  You have severe belly cramping or pain.  You lose or gain weight rapidly.  You have trouble catching your breath and have chest pain.  You notice sudden or extreme puffiness (swelling) of your face, hands, ankles, feet, or legs.  You have not felt the baby move in over an hour.  You have severe headaches that do not go away when you take medicine.  You have trouble seeing. Summary  The second trimester is from week 14 through week 27 (months 4 through 6). This is often the time in pregnancy that you feel your best.  To take care of yourself and your unborn baby, you will need to eat healthy meals, take medicines only if your doctor tells you to do so, and do activities that are safe for you and your baby.  Call your doctor if you get sick or if you notice anything unusual about your pregnancy. Also, call your doctor if you need help with the right food to eat, or if you want to know what activities are safe for you. This information is not intended to replace advice given to you by your health care provider. Make sure you discuss any questions you have with your health care provider. Document Revised: 04/12/2018 Document Reviewed: 01/25/2016 Elsevier Patient Education  2020 Elsevier Inc.  AREA PEDIATRIC/FAMILY PRACTICE PHYSICIANS  [place] ([address]) . [facility] o Chambliss, MD; Eniola, MD; Hale, MD; Hensel, MD; McDiarmid, MD; McIntyer, MD; Neal, MD; Walden, MD o [address] [place], [place] [address] o [phone] o Mon-Fri 8:30-12:30, 1:30-5:00 o Providers come to see babies at [facility] o Accepting Medicaid . [facility] o Limited providers who accept newborns: Koirala, MD; Morrow, MD; Wolters, MD o [address], [place], [place] [address] o [phone] o Mon-Fri 8:00-5:30 o Babies seen by providers at [facility] o Does NOT accept Medicaid o Please call early in hospitalization for appointment (limited availability)  . [facility] o Mulberry, MD o [address] [place], [place] [address] o [phone] o Mon, Tue, Thur, Fri 8:30-5:00, Wed 10:00-7:00 (closed 1-2pm) o Babies seen by [facility] providers o Accepting Medicaid . [facility] o Rubin, MD o [address], [place], [place] [address] o [phone] o Mon-Fri 8:30-5:00, Sat 8:30-12:00 o Provider comes to see babies at [facility] o Accepting Medicaid o Must have been referred from current patients or contacted office prior to delivery . [facility] (  [facility]) o Brown, MD; Chandler, MD; Ettefagh, MD; Grant, MD; Lester, MD; McCormick, MD; McQueen, MD; Prose, MD; Simha, MD; Stanley, MD; Stryffeler, NP; Tebben, NP o [address], [place], [place] [address] o [phone] o Mon, Tue, Thur, Fri 8:30-5:30, Wed 9:30-5:30, Sat 8:30-12:30 o Babies seen by [facility] providers o Accepting Medicaid o Only accepting infants of first-time parents or siblings of current patients o Hospital discharge coordinator will make follow-up appointment . Jack Amos o [address], [place], [place]  [address] o [phone]   Fax - [phone] . [facility] o [address], [place], [place]  [address] o Phone - [phone]   Fax - [phone] . Shilpa Gosrani o [address], [place], [place]  [address] o [phone]  [place] ([address]) . [facility] o Bates, MD; [place]assfield, MD; Cooper, Cox, MD; MD; Davis, MD; Dovico, MD;  Ettefaugh, MD; Little, MD; Lowe, MD; Keiffer, MD; Melvin, MD; Sumner, MD; Williams, MD o [address], [place], [place] [address] o [phone] o Mon-Fri 8:30-5:00 (extended evenings Mon-Thur as needed), Sat-Sun 10:00-1:00 o Providers come to see babies at [facility] o Accepting Medicaid for families of first-time babies and families with all children in the household age 3 and under. Must register with office prior to making appointment (M-F only). . [facility] o Henson, NP; Knapp, MD; Lalonde, MD; Tysinger, PA o [address] [place], [place] [address] o [phone] o Mon-Fri 8:00-5:00 o Babies seen by providers at [facility] o Does NOT accept Medicaid/Commercial Insurance Only . [facility] ([facility])  o Artis, MD; Barnes, MD; Bratton, MD; Coccaro, MD; [place] Gardner, MD; Kramer, MD; Marshall, MD; Netherton, MD; Poleto, MD; Skinner, MD o [address] [place], [place] [address] o [phone] o Mon-Fri 8:30-5:30, Sat (Oct.-Mar.) 9:00-1:00 o Babies seen by providers at [facility] o Accepting Medicaid  [place] ([address]) . [facility] o Reid, MD; Warner, MD o [address], [place], [place] [address] o [phone] o Mon-Fri 8:30-5:00, Sat 8:30-12:00 o Providers come to see babies at [facility] o Does NOT accept Medicaid . [facility] o Becker, PA; Hagler, MD; Scifres, PA; Sun, MD; Swayne, MD o [address], [place], [place] [address] o [phone] o Mon-Fri 8:00-5:00 o Babies seen by providers at [facility] o Does NOT accept Medicaid o Only accepting babies of parents who are patients o Please call early in hospitalization for appointment (limited availability) . [facility] o Clark, MD; Frye, MD; Kelleher, MD; Mack, NP; Miller, MD; O'Keller, MD; Patterson, NP; Pudlo, MD; Puzio, MD; Thomas, MD;  Tucker, MD; Twiselton, MD o [address], [place], [place] [address] o [phone] o Mon-Fri 8:00-5:00, Sat 9:00-12:00 o Providers come to see babies at [facility] o Does NOT accept Medicaid  [place] ([address]) . [facility] o Limited providers accepting new patients: Brake, NP; Wharton, PA o [address], [place],  [address] o [phone] o Mon-Fri 8:00-5:00 o Babies seen by providers at [facility] o Does NOT accept Medicaid o Only accepting babies of parents who are patients o Please call early in hospitalization for appointment (limited availability) . [facility] o Gay, MD; Quinlan, MD o [address] ,  [address] o [phone] (press 1 to schedule appointment) o Mon-Fri 8:00-5:00 o Providers come to   see babies at [facility] o Does NOT accept Medicaid . [facility] o Mazer, MD o [address] [place], [place] [address] o [phone] o Mon-Fri 8:30-5:00 (lunch 12:30-1:00), extended hours by appointment only Wed 5:00-6:30 o Babies seen by [facility] providers o Accepting Medicaid . [facility] o Banks, MD; Jordan, MD; Koberlein, MD o [address], [place], [place] [address] o [phone] o Mon-Fri 8:00-5:00 o Babies seen by [facility] providers o Does NOT accept Medicaid . [facility] o Parker, MD; Hunter, MD; Wallace, DO o [address] [place], [place] [address] o [phone] o Mon-Fri 8:00-5:00 o Babies seen by [facility] providers o Does NOT accept Medicaid . [facility] o Brandon, PA; Brecken, PA; Christy, NP; Dees, MD; DeClaire, MD; DeWeese, MD; Hansen, NP; Mills, NP; Parrish, NP; Smoot, NP; Summer, MD; Vapne, MD o [address] [place], [place] [address] o [phone] o Mon-Fri 8:30-5:00, Sat 10:00-1:00 o Providers come to see babies at [facility] o Does NOT accept Medicaid o Free prenatal information session Tuesdays at 4:45pm . [facility] o Bouska, MD; Gordon, PA; Jeffery, PA; Weber, PA o [address] [place] [address] o [phone] o Mon-Fri 7:30-5:30 o Babies seen by [facility] providers . [facility] o [address], [place], [place]  [address] o [phone]   Fax - [phone]  [place] ([address] & [address]) . [facility] o Reese, MD o [address] [place], [place] [address] o [phone] o Mon-Thur 8:00-6:00 o Providers come to see babies at [facility] o Accepting Medicaid . [facility] o Anderson, NP; Badger, MD; Beal, PA; Spencer, PA o [address] [place], [place] [address] o [phone] o Mon-Thur 7:30-7:30, Fri 7:30-4:30 o Babies seen by [facility] providers o Accepting Medicaid . [facility] o Agbuya, MD; Klett, NP; Romgoolam, MD o [address], [place], [place] [address] o [phone] o Mon-Fri 8:30-5:00, Sat 8:30-12:00 o Providers come to see babies at [facility] o Accepting Medicaid o Must have "Meet & Greet" appointment at office prior to delivery . [facility] ([facility] o McCord, MD; Wallace, MD; Wood, MD o [address], [place], [place] [address] o [phone] o Mon-Wed 8:00-6:00, Thur-Fri 8:00-5:00, Sat 9:00-12:00 o Providers come to see babies at [facility] o Does NOT accept Medicaid o Only accepting siblings of current patients . [facility]  o [address], [place], [place]  [address] o [phone]   Fax - [phone] . [facility] o [address], [place], [place]  [address] o [phone]   Fax - [phone]  [place] ([address] & [address]) . [facility] o Cirigliano, DO; Matthews, DO o [address] [place], [place] [address] o [phone] o Mon-Fri 7:00-5:00 o Babies seen by [facility] providers o Does NOT accept Medicaid . [facility] o Briscoe, MD; Howley, PA; Moreira, PA o [address], [place], [place] [address] o [phone] o Mon-Fri 8:00-5:00 o Babies seen by [facility] providers o Accepting Medicaid . [facility] o Boyd, MD; Church, PA; Jones, NP; Osborn, PA o [address], ,  [address] o [phone] o Mon-Fri 8:00-5:00 o Babies seen by providers at [facility] o   Accepting Medicaid  [place] ([address]) . [facility] [place] o Wendling, DO o [address] [place], [place] [address] o [phone] o Mon-Fri 8:00-5:00 o Babies seen by [facility] providers o Does NOT accept Medicaid o Limited availability, please call early in hospitalization to schedule follow-up . [facility] o Calderon, PA; Cummings, MD; Dillard, MD; Martin, PA; Olson, MD; VanDeven, PA o [address], [place], [place] [address] o [phone] o Mon-Fri 8:30-5:00, Sat 9:00-12:00 o Babies seen by providers at [facility] o Accepting Medicaid o Please register online then schedule online or call office o [email or web address] . [facility] ([facility] o Hunter, NP; Kumar, MD; Martin Rogers, PA o [address], [place], [place] [address] o [phone] o Mon-Fri 8:00-5:00 o Babies seen by providers at [facility] o Accepting Medicaid . [facility] ([facility] o [place], MD; Kristi Fleenor, NP; West, MD o [address], [place], [place] [address] o [phone] o Mon-Fri 8:00-5:30, Sat&Sun by  appointment (phones open at 8:30) o Babies seen by [facility] providers o Accepting Medicaid o Must be a first-time baby or sibling of current patient . [facility] [place]  o [address], [place], [place]  [address] o [phone]   Fax - [phone]  [place] ([address]) . [facility] o Brown, PA; Cowen, PA; Rice, MD; Helton, PA; Spry, MD o [address] [place], [place] [address] o [phone] o Mon-Thur 8:00-7:00, Fri 8:00-5:00, Sat 8:00-12:00, Sun 9:00-12:00 o Babies seen by [facility] providers o Accepting Medicaid . [facility] o Coe-Goins, MD; Marshall, MD; Pierre-Louis, MD o [address], [place], [place] [address] o [phone] o Mon-Thur 8:00-5:00 o Babies seen by providers at [facility] o Accepting Medicaid . [facility] o Bratton, MD; Coe-Goins, MD; Hayes, MD; Lewis, MD; List, MD; Lott, MD; Marshall, MD; Moran, MD; O'Neal, MD; Pierre-Louis, MD; Pitonzo, MD; Scholer, MD; Spangle, MD o [address] [place], [place] [address] o [phone] o Mon-Fri 8:00-5:30, Sat (Oct.-Mar.) 9:00-1:00 o Babies seen by providers at [facility] o Accepting Medicaid o Must fill out new patient packet, available online at [email or web address] . [facility] ([facility]) o Friddle, NP; Harris, NP; Kelly, NP; Logan, MD; Melvin, PA; Poth, MD; Ramadoss, MD; Stanton, NP o [address], [place], [place] [address] o [phone] o Mon-Thur 8:00-5:30, Fri 8:00-5:00 o Babies seen by providers at [facility] o Accepting Medicaid  [place] ([address]) . [facility] o Dixon, PA; [place], MD; Pickard, MD; Tapia, PA o [address], [place], [place] [address] o [phone] o Mon-Fri 8:00-5:00 o Babies seen  by providers at [facility] o Accepting Medicaid   [place] ([address]) . [facility] [place] o Masneri, DO; Meyers, MD; Nelson, PA o [address], [place], [place] [address] o [phone] o Mon-Fri 8:00-5:00 o Babies seen by providers at [facility] o Does NOT accept Medicaid o Limited appointment availability, please call early in hospitalization  . [facility] [place] o Kunedd, DO; McGowen, MD o [address], [place],  [address] o [phone] o   Mon-Fri 8:00-5:00 o Babies seen by [facility] providers o Does NOT accept Medicaid . [facility] [place] o Cameron, MD; MacDonald, MD; Michaels, PA; Nayak, MD o [address] [place] [address], [place], [place] [address] o [phone] o Mon-Fri 8:00-5:00 o After hours clinic ([address] [place], [place] [address]) [phone] Mon-Fri 5:00-8:00, Sat 12:00-6:00, Sun 10:00-4:00 o Babies seen by [facility] providers o Accepting Medicaid . [facility] [place] o [address], [place], [place]  [address] o [phone]   Fax - [phone]  [place] ([address]) . [facility] [place] o Andy, MD o [address], [place], [place] [address] o [phone] o Mon-Fri 8:00-5:00 o Babies seen by [facility] providers o Does NOT accept Medicaid . [facility] [place] ([facility] [place]) o Eksir, MD o [address], [place], [place] [address] o [phone] o Mon-Thur 8:00-7:00, Fri 8:00-5:00, Sat 8:00-12:00 o Babies seen by providers at [facility] o Accepting Medicaid - but does not have vaccinations in office (must be received elsewhere) o Limited availability, please call early in hospitalization  [place] ([address]) . [facility]  o Charlene Flemming, MD o [address], [place] [address] o [phone]  Fax [phone]    

## 2019-07-22 NOTE — Progress Notes (Signed)
  Subjective:    Patty Smith is being seen today for her first obstetrical visit.  This is not a planned pregnancy but she is happy about it. She is at [redacted]w[redacted]d gestation. Her obstetrical history is significant for c/section for failure to dilate. . Relationship with FOB: significant other, living together. Patient does intend to breast feed. Pregnancy history fully reviewed. Patient states that she did not dilate past 5 cm in her first delivery; states that her "cervix was swollen" and that her pelvis was "broken". Had complications with her c/section and required general anesthesia.   Patient reports no complaints.  Review of Systems:   Review of Systems  Constitutional: Negative.   HENT: Negative.   Eyes: Negative.   Respiratory: Negative.   Cardiovascular: Negative.   Gastrointestinal: Negative.   Genitourinary: Negative.   Musculoskeletal: Negative.   Neurological: Negative.     Objective:     BP 126/75   Pulse 91   Wt 209 lb 12.8 oz (95.2 kg)   LMP 04/03/2019 (Exact Date)   BMI 34.91 kg/m  Physical Exam HENT:     Nose: Nose normal.  Cardiovascular:     Rate and Rhythm: Normal rate.     Pulses: Normal pulses.  Abdominal:     General: Abdomen is flat.  Genitourinary:    General: Normal vulva.  Musculoskeletal:        General: Normal range of motion.  Skin:    General: Skin is warm.  Neurological:     General: No focal deficit present.     Mental Status: She is alert.     Maternal Exam:  Introitus: Normal vulva.      Assessment:    Pregnancy: G2P1001 Patient Active Problem List   Diagnosis Date Noted  . Supervision of low-risk pregnancy 07/17/2019  . Obesity in pregnancy 07/17/2019  . Depression   . Anxiety   . History of C-section   . TOBACCO USER 01/04/2009  . DEPRESSION 11/18/2008  . BACK PAIN, LUMBAR, CHRONIC 11/18/2008       Plan:     Initial labs drawn. Prenatal vitamins. Problem list reviewed and updated. AFP3 discussed: ordered. Role  of ultrasound in pregnancy discussed; fetal survey: ordered. Amniocentesis discussed: not indicated. Follow up in 6 weeks. 50% of 30 min visit spent on counseling and coordination of care.  -[place] ordered -Genetic testing ordered -BabyRX ordered; patient will go and pick up cuff.  -Patient has My Chart ; knows that some of her visits will be virtual.  -Patient will sign release of information to get records from Provider in [place] -Discussed that she will need to meet with MD to talk about VBAC Charlesetta Garibaldi [facility] 07/22/2019

## 2019-07-23 LAB — CBC/D/PLT+RPR+RH+ABO+RUB AB...
Antibody Screen: NEGATIVE
Basophils Absolute: 0 10*3/uL (ref 0.0–0.2)
Basos: 0 %
EOS (ABSOLUTE): 0.2 10*3/uL (ref 0.0–0.4)
Eos: 2 %
HCV Ab: <0.1 s/co ratio (ref 0.0–0.9)
HIV Screen 4th Generation wRfx: NONREACTIVE
Hematocrit: 40.8 % (ref 34.0–46.6)
Hemoglobin: 13 g/dL (ref 11.1–15.9)
Hepatitis B Surface Ag: NEGATIVE
Immature Grans (Abs): 0.1 10*3/uL (ref 0.0–0.1)
Immature Granulocytes: 1 %
Lymphocytes Absolute: 2 10*3/uL (ref 0.7–3.1)
Lymphs: 16 %
MCH: 27.6 pg (ref 26.6–33.0)
MCHC: 31.9 g/dL (ref 31.5–35.7)
MCV: 87 fL (ref 79–97)
Monocytes Absolute: 0.7 10*3/uL (ref 0.1–0.9)
Monocytes: 6 %
Neutrophils Absolute: 9.5 10*3/uL — ABNORMAL HIGH (ref 1.4–7.0)
Neutrophils: 75 %
Platelets: 227 10*3/uL (ref 150–450)
RBC: 4.71 x10E6/uL (ref 3.77–5.28)
RDW: 13.3 % (ref 11.7–15.4)
RPR Ser Ql: NONREACTIVE
Rh Factor: POSITIVE
Rubella Antibodies, IGG: 3.41 index (ref >0.99)
WBC: 12.5 10*3/uL — ABNORMAL HIGH (ref 3.4–10.8)

## 2019-07-23 LAB — CERVICOVAGINAL ANCILLARY ONLY
Chlamydia: NEGATIVE
Comment: NEGATIVE
Comment: NORMAL
Neisseria Gonorrhea: NEGATIVE

## 2019-07-23 LAB — HEMOGLOBIN A1C
Est. average glucose Bld gHb Est-mCnc: 111 mg/dL
Hgb A1c MFr Bld: 5.5 % (ref 4.8–5.6)

## 2019-07-23 LAB — HCV INTERPRETATION

## 2019-07-25 ENCOUNTER — Encounter: Payer: Self-pay | Admitting: Student

## 2019-07-25 ENCOUNTER — Other Ambulatory Visit: Payer: Self-pay | Admitting: Student

## 2019-07-25 DIAGNOSIS — R8271 Bacteriuria: Secondary | ICD-10-CM | POA: Insufficient documentation

## 2019-07-25 LAB — URINE CULTURE, OB REFLEX

## 2019-07-25 LAB — CULTURE, OB URINE

## 2019-07-25 MED ORDER — AMOXICILLIN 875 MG PO TABS
875.0000 mg | ORAL_TABLET | Freq: Two times a day (BID) | ORAL | 0 refills | Status: DC
Start: 2019-07-25 — End: 2019-08-10

## 2019-07-29 NOTE — BH Specialist Note (Signed)
Integrated Behavioral Health via Telemedicine Video (Caregility) Visit  07/29/2019 Patty Smith [id number]  Number of Integrated Behavioral Health visits: 3 Session Start time: 2:17  Session End time: 2:49 Total time: 32   Referring Provider: Scheryl Darter, MD Type of Visit: Video Patient/Family location: Home  [facility] Provider location: [facility]  All persons participating in visit: Patient Patty Smith and [facility] Tyvon Eggenberger    Confirmed patient's address: Yes  Confirmed patient's phone number: Yes  Any changes to demographics: No   Confirmed patient's insurance: Yes  Any changes to patient's insurance: No   Discussed confidentiality: at previous visi  I connected with Patty Smith  by a video enabled telemedicine application (Caregility) and verified that I am speaking with the correct person using two identifiers.     I discussed the limitations of evaluation and management by telemedicine and the availability of in person appointments.  I discussed that the purpose of this visit is to provide behavioral health care while limiting exposure to the novel coronavirus.   Discussed there is a possibility of technology failure and discussed alternative modes of communication if that failure occurs.  I discussed that engaging in this virtual visit, they consent to the provision of behavioral healthcare and the services will be billed under their insurance.  Patient and/or legal guardian expressed understanding and consented to virtual visit: Yes   PRESENTING CONCERNS: Patient and/or family reports the following symptoms/concerns: Pt states her primary concern today is continued nausea; is using self-coping strategies daily to manage symptoms of depression, anxiety, and life stress.  Duration of problem: Increase in current pregnancy; Severity of problem: mild  STRENGTHS (Protective Factors/Coping Skills): Supportive family; adhering to  treatment; motivated for change; positive, future-oriented outlook  GOALS ADDRESSED: Patient will: 1.  Maintain reduction of symptoms of: anxiety, depression and stress  2.  Demonstrate ability to: Increase healthy adjustment to current life circumstances  INTERVENTIONS: Interventions utilized:  Supportive Counseling Standardized Assessments completed: GAD-7 and PHQ 9  ASSESSMENT: Patient currently experiencing Adjustment disorder with mixed anxiety and depressed mood and Psychosocial stress.   Patient may benefit from continued psychoeducation and brief therapeutic interventions regarding maintaining reduction of symptoms of anxiety, depression, and life stress .  PLAN: 1. Follow up with behavioral health clinician on : As needed, Call Branna Cortina at [phone] or main line at [phone] to schedule follow up visit 2. Behavioral recommendations:  -Continue using nausea medication as prescribed -Continue using self-coping strategies that have been helpful in managing symptoms (relaxation breathing, outdoor activities with children, savoring quiet moments to mindfully consider next steps in life) -Continue working with unemployment lawyer 3. Referral(s): Integrated [employer] (In Clinic)  I discussed the assessment and treatment plan with the patient and/or parent/guardian. They were provided an opportunity to ask questions and all were answered. They agreed with the plan and demonstrated an understanding of the instructions.   They were advised to call back or seek an in-person evaluation if the symptoms worsen or if the condition fails to improve as anticipated.  [facility]  Depression screen [facility] 2/9 08/05/2019 07/22/2019 07/17/2019 06/09/2019 10/31/2017  Decreased Interest 1 2 2 2 1   Down, Depressed, Hopeless 0 2 2 2 1   PHQ - 2 Score 1 4 4 4 2   Altered sleeping 1 2 2 3 1   Tired, decreased energy 1 2 2 2 1   Change in appetite 0 2 1 3  0  Feeling bad or failure  about yourself  0 3 1 1 1   Trouble concentrating 1 1 1 2  0  Moving slowly or fidgety/restless 0 1 1 1  0  Suicidal thoughts 0 0 0 1 0  PHQ-9 Score 4 15 12 17 5     GAD 7 : Generalized Anxiety Score 08/05/2019 07/22/2019 07/17/2019 06/09/2019  Nervous, Anxious, on Edge 0 2 1 3   Control/stop worrying 1 2 1 3   Worry too much - different things 1 3 1 3   Trouble relaxing 1 2 2 2   Restless 0 1 1 2   Easily annoyed or irritable 1 3 3 3   Afraid - awful might happen 0 1 1 1   Total GAD 7 Score 4 14 10  17

## 2019-08-05 ENCOUNTER — Ambulatory Visit (INDEPENDENT_AMBULATORY_CARE_PROVIDER_SITE_OTHER): Payer: Medicaid Other | Admitting: Clinical

## 2019-08-05 ENCOUNTER — Other Ambulatory Visit: Payer: Self-pay

## 2019-08-05 DIAGNOSIS — Z658 Other specified problems related to psychosocial circumstances: Secondary | ICD-10-CM | POA: Diagnosis not present

## 2019-08-05 DIAGNOSIS — F4323 Adjustment disorder with mixed anxiety and depressed mood: Secondary | ICD-10-CM | POA: Diagnosis not present

## 2019-08-05 NOTE — Patient Instructions (Signed)
[facility] [address] [place], [place] [address] [phone] (main office) [phone] (Prentiss Polio's office)   

## 2019-08-07 NOTE — Progress Notes (Signed)
Chart reviewed for nurse visit. Agree with plan of care.   Marylene Land, CNM 08/07/2019 1:46 PM

## 2019-08-08 ENCOUNTER — Encounter: Payer: Self-pay | Admitting: Family Medicine

## 2019-08-08 ENCOUNTER — Telehealth (INDEPENDENT_AMBULATORY_CARE_PROVIDER_SITE_OTHER): Payer: Medicaid Other | Admitting: Lactation Services

## 2019-08-08 DIAGNOSIS — Z3492 Encounter for supervision of normal pregnancy, unspecified, second trimester: Secondary | ICD-10-CM

## 2019-08-08 NOTE — Telephone Encounter (Signed)
Called patient and informed her that she is a silent carrier for Alpha Thalassemia. Reviewed that we recommend she call and set up a Genetic Counseling session with [employer]. Phone Number given to call.   Reviewed with patient that it will be recommended that FOB also be tested to see if he carries the gene.   Patient voiced understanding with no questions or concerns at this time.

## 2019-08-10 ENCOUNTER — Other Ambulatory Visit: Payer: Self-pay

## 2019-08-10 ENCOUNTER — Inpatient Hospital Stay (HOSPITAL_COMMUNITY)
Admission: AD | Admit: 2019-08-10 | Discharge: 2019-08-10 | Disposition: A | Discharge status: Home / Self Care | Payer: Medicaid Other | Attending: Obstetrics & Gynecology | Admitting: Obstetrics & Gynecology

## 2019-08-10 ENCOUNTER — Inpatient Hospital Stay (HOSPITAL_COMMUNITY): Payer: Medicaid Other

## 2019-08-10 ENCOUNTER — Encounter (HOSPITAL_COMMUNITY): Payer: Self-pay | Admitting: Obstetrics & Gynecology

## 2019-08-10 DIAGNOSIS — O34212 Maternal care for vertical scar from previous cesarean delivery: Secondary | ICD-10-CM | POA: Diagnosis not present

## 2019-08-10 DIAGNOSIS — O26892 Other specified pregnancy related conditions, second trimester: Secondary | ICD-10-CM | POA: Diagnosis not present

## 2019-08-10 DIAGNOSIS — Z3A17 17 weeks gestation of pregnancy: Secondary | ICD-10-CM | POA: Insufficient documentation

## 2019-08-10 DIAGNOSIS — K760 Fatty (change of) liver, not elsewhere classified: Secondary | ICD-10-CM

## 2019-08-10 DIAGNOSIS — O99612 Diseases of the digestive system complicating pregnancy, second trimester: Secondary | ICD-10-CM | POA: Insufficient documentation

## 2019-08-10 DIAGNOSIS — O34211 Maternal care for low transverse scar from previous cesarean delivery: Secondary | ICD-10-CM | POA: Insufficient documentation

## 2019-08-10 DIAGNOSIS — K5900 Constipation, unspecified: Secondary | ICD-10-CM | POA: Insufficient documentation

## 2019-08-10 DIAGNOSIS — Z79899 Other long term (current) drug therapy: Secondary | ICD-10-CM | POA: Diagnosis not present

## 2019-08-10 DIAGNOSIS — O99212 Obesity complicating pregnancy, second trimester: Secondary | ICD-10-CM | POA: Insufficient documentation

## 2019-08-10 DIAGNOSIS — E669 Obesity, unspecified: Secondary | ICD-10-CM | POA: Diagnosis not present

## 2019-08-10 DIAGNOSIS — Z87891 Personal history of nicotine dependence: Secondary | ICD-10-CM | POA: Insufficient documentation

## 2019-08-10 DIAGNOSIS — O99340 Other mental disorders complicating pregnancy, unspecified trimester: Secondary | ICD-10-CM

## 2019-08-10 DIAGNOSIS — F419 Anxiety disorder, unspecified: Secondary | ICD-10-CM

## 2019-08-10 DIAGNOSIS — R1031 Right lower quadrant pain: Secondary | ICD-10-CM | POA: Insufficient documentation

## 2019-08-10 DIAGNOSIS — O99210 Obesity complicating pregnancy, unspecified trimester: Secondary | ICD-10-CM

## 2019-08-10 DIAGNOSIS — O219 Vomiting of pregnancy, unspecified: Secondary | ICD-10-CM | POA: Insufficient documentation

## 2019-08-10 DIAGNOSIS — Z98891 History of uterine scar from previous surgery: Secondary | ICD-10-CM

## 2019-08-10 DIAGNOSIS — Z3492 Encounter for supervision of normal pregnancy, unspecified, second trimester: Secondary | ICD-10-CM

## 2019-08-10 DIAGNOSIS — O26612 Liver and biliary tract disorders in pregnancy, second trimester: Secondary | ICD-10-CM | POA: Diagnosis not present

## 2019-08-10 LAB — COMPREHENSIVE METABOLIC PANEL
ALT: 9 U/L (ref 0–44)
AST: 11 U/L — ABNORMAL LOW (ref 15–41)
Albumin: 3 g/dL — ABNORMAL LOW (ref 3.5–5.0)
Alkaline Phosphatase: 41 U/L (ref 38–126)
Anion gap: 9 (ref 5–15)
BUN: <5 mg/dL — ABNORMAL LOW (ref 6–20)
CO2: 23 mmol/L (ref 22–32)
Calcium: 8.9 mg/dL (ref 8.9–10.3)
Chloride: 104 mmol/L (ref 98–111)
Creatinine, Ser: 0.52 mg/dL (ref 0.44–1.00)
GFR calc Af Amer: >60 mL/min (ref >60)
GFR calc non Af Amer: >60 mL/min (ref >60)
Glucose, Bld: 66 mg/dL — ABNORMAL LOW (ref 70–99)
Potassium: 4.3 mmol/L (ref 3.5–5.1)
Sodium: 136 mmol/L (ref 135–145)
Total Bilirubin: 0.5 mg/dL (ref 0.3–1.2)
Total Protein: 6.2 g/dL — ABNORMAL LOW (ref 6.5–8.1)

## 2019-08-10 LAB — URINALYSIS, ROUTINE W REFLEX MICROSCOPIC
Bilirubin Urine: NEGATIVE
Glucose, UA: NEGATIVE mg/dL
Hgb urine dipstick: NEGATIVE
Ketones, ur: NEGATIVE mg/dL
Leukocytes,Ua: NEGATIVE
Nitrite: NEGATIVE
Protein, ur: NEGATIVE mg/dL
Specific Gravity, Urine: 1.02 (ref 1.005–1.030)
pH: 6 (ref 5.0–8.0)

## 2019-08-10 LAB — CBC
HCT: 38.8 % (ref 36.0–46.0)
Hemoglobin: 12.4 g/dL (ref 12.0–15.0)
MCH: 27.7 pg (ref 26.0–34.0)
MCHC: 32 g/dL (ref 30.0–36.0)
MCV: 86.8 fL (ref 80.0–100.0)
Platelets: 214 10*3/uL (ref 150–400)
RBC: 4.47 MIL/uL (ref 3.87–5.11)
RDW: 13.9 % (ref 11.5–15.5)
WBC: 13.1 10*3/uL — ABNORMAL HIGH (ref 4.0–10.5)
nRBC: 0 % (ref 0.0–0.2)

## 2019-08-10 LAB — WET PREP, GENITAL
Clue Cells Wet Prep HPF POC: NONE SEEN
Sperm: NONE SEEN
Trich, Wet Prep: NONE SEEN
Yeast Wet Prep HPF POC: NONE SEEN

## 2019-08-10 MED ORDER — IOHEXOL 9 MG/ML PO SOLN
ORAL | Status: AC
  Filled 2019-08-10: qty 1000

## 2019-08-10 MED ORDER — IOHEXOL 300 MG/ML  SOLN
100.0000 mL | Freq: Once | INTRAMUSCULAR | Status: AC | PRN
  Administered 2019-08-10: 100 mL via INTRAVENOUS

## 2019-08-10 MED ORDER — ACETAMINOPHEN 325 MG PO TABS
650.0000 mg | ORAL_TABLET | Freq: Once | ORAL | Status: AC
  Administered 2019-08-10: 650 mg via ORAL
  Filled 2019-08-10: qty 2

## 2019-08-10 MED ORDER — IOHEXOL 9 MG/ML PO SOLN: 500.0000 mL | ORAL | Status: AC

## 2019-08-10 NOTE — MAU Note (Signed)
Patty Smith is a 29 y.o. at [redacted]w[redacted]d here in MAU reporting: woke up this AM with sharp, stiff abdominal pain. States it was in the front of her abdomen. Thought it might be gas. Tried to use the bathroom but that didn't help. No vaginal bleeding or discharge.  Onset of complaint: today  Pain score: 7/10  Vitals:   08/10/19 1303  BP: 116/68  Pulse: 97  Resp: 18  Temp: 98.5 F (36.9 C)  SpO2: 98%     FHT:166  Lab orders placed from triage: UA

## 2019-08-10 NOTE — MAU Provider Note (Signed)
Chief Complaint: Abdominal Pain   First Provider Initiated Contact with Patient 08/10/19 1336     SUBJECTIVE HPI: Patty Smith is a 29 y.o. G2P1001 at [redacted]w[redacted]d by L/7 who presents to maternity admissions reporting severe RLQ pain. Pt reports that she awoke suddenly at 0100 this AM given a sharp, constant non-radiating pain in her RLQ rated 7/10. Worse with movement but not affected by po intake. Pt reports mild nausea and non-bloody emesis, not worse than her baseline in pregnancy. She last ate a bag of chips at 1000 this morning. She had a normal BM today but does report frequent constipation in pregnancy. No fever, chills, cough, congestion, chest pain, SOB, or diarrhea. She denies vaginal bleeding, vaginal itching/burning, urinary symptoms, h/a, dizziness, or fever/chills. She has a history of CS in s/o arrest of dilation. No medications other than promethazine, and as needed tylenol in pregnancy. She recently learned she is a carrier for alpha thal, at which time she stopped her prenatal vitamins given concerns of the iron.  HPI  Past Medical History:  Diagnosis Date  . Anxiety   . Complication of anesthesia    epidural /or spinal didin't work- stuck many times  . Depression   . Endometriosis    not officially diagnosed by surgery or [place] but prior MD treated with ocp's   . Scarlet fever 2001  . Viral meningitis    Past Surgical History:  Procedure Laterality Date  . CESAREAN SECTION     Social History   Socioeconomic History  . Marital status: Divorced    Spouse name: Not on file  . Number of children: Not on file  . Years of education: Not on file  . Highest education level: Not on file  Occupational History  . Not on file  Tobacco Use  . Smoking status: Former Smoker    Packs/day: 0.30    Types: Cigarettes    Quit date: 05/05/2019    Years since quitting: 0.2  . Smokeless tobacco: Never Used  Vaping Use  . Vaping Use: Never used  Substance and Sexual Activity  . Alcohol use:  Not Currently    Comment: Occ  . Drug use: Not Currently    Types: Marijuana    Comment: last use was 05/05/2019  . Sexual activity: Yes    Birth control/protection: None  Other Topics Concern  . Not on file  Social History Narrative  . Not on file   Social Determinants of Health   Financial Resource Strain:   . Difficulty of Paying Living Expenses:   Food Insecurity: No Food Insecurity  . Worried About Programme researcher, broadcasting/film/video in the Last Year: Never true  . Ran Out of Food in the Last Year: Never true  Transportation Needs: No Transportation Needs  . Lack of Transportation (Medical): No  . Lack of Transportation (Non-Medical): No  Physical Activity:   . Days of Exercise per Week:   . Minutes of Exercise per Session:   Stress:   . Feeling of Stress :   Social Connections:   . Frequency of Communication with Friends and Family:   . Frequency of Social Gatherings with Friends and Family:   . Attends Religious Services:   . Active Member of Clubs or Organizations:   . Attends Banker Meetings:   Marland Kitchen Marital Status:   Intimate Partner Violence:   . Fear of Current or Ex-Partner:   . Emotionally Abused:   Marland Kitchen Physically Abused:   . Sexually Abused:  No current facility-administered medications on file prior to encounter.   Current Outpatient Medications on File Prior to Encounter  Medication Sig Dispense Refill  . acetaminophen (TYLENOL) 500 MG tablet Take 2 tablets (1,000 mg total) by mouth every 8 (eight) hours as needed. 30 tablet 0  . Blood Pressure Monitoring (BLOOD PRESSURE KIT) DEVI 1 Device by Does not apply route as needed. (Patient not taking: Reported on 07/22/2019) 1 each 0  . metoCLOPramide (REGLAN) 10 MG tablet Take 1 tablet (10 mg total) by mouth every 6 (six) hours. (Patient not taking: Reported on 05/29/2019) 20 tablet 0  . ondansetron (ZOFRAN) 4 MG tablet Take 1 tablet (4 mg total) by mouth every 8 (eight) hours as needed for nausea or vomiting. (Patient  not taking: Reported on 05/29/2019) 16 tablet 0  . polyethylene glycol powder (GLYCOLAX/MIRALAX) 17 GM/SCOOP powder Take 17 g by mouth daily as needed. 510 g 1  . Prenatal Vit-Fe Fumarate-FA (PRENATAL VITAMIN PO) Take 1 tablet by mouth daily.    . promethazine (PHENERGAN) 12.5 MG tablet Take 1 tablet (12.5 mg total) by mouth every 6 (six) hours as needed for nausea or vomiting. 30 tablet 0   Allergies  Allergen Reactions  . Sulfur     Unknown rxn    ROS:  Review of Systems  Constitutional: Positive for appetite change. Negative for activity change, chills and fever.  HENT: Negative for congestion and sore throat.   Eyes: Negative for photophobia and visual disturbance.  Respiratory: Negative for cough, chest tightness and shortness of breath.   Cardiovascular: Negative for chest pain.  Gastrointestinal: Positive for abdominal pain, constipation, nausea and vomiting. Negative for abdominal distention, anal bleeding, blood in stool and diarrhea.  Genitourinary: Negative for dyspareunia, dysuria, flank pain, hematuria, vaginal bleeding, vaginal discharge and vaginal pain.  Musculoskeletal: Negative for back pain.  Neurological: Negative for dizziness and headaches.    I have reviewed patient's Past Medical Hx, Surgical Hx, Family Hx, Social Hx, medications and allergies.   Physical Exam   Patient Vitals for the past 24 hrs:  BP Temp Temp src Pulse Resp SpO2 Height Weight  08/10/19 1921 115/64 -- -- 79 -- -- -- --  08/10/19 1303 116/68 98.5 F (36.9 C) Oral 97 18 98 % -- --  08/10/19 1259 -- -- -- -- -- -- _0  (1.651 m) 96.3 kg   Constitutional: Well-developed, well-nourished female in no acute distress.  Cardiovascular: RRR Respiratory: normal effort, CTAB GI: Abd soft, non-distended, exquisite tenderness over RLQ to palpation. Negative Murphy's sign. No rebound or guarding. Normal BS in all 4 quadrants. No CVA tenderness. MS: Extremities nontender, no edema, normal  ROM Neurologic: Alert and oriented x 4.  GU:  PELVIC EXAM: Cervix pink, visually closed, without lesion, scant white creamy discharge, vaginal walls and external genitalia normal Bimanual exam: deferred given visually closed cervix FHT 166 by doppler  LAB RESULTS Results for orders placed or performed during the hospital encounter of 08/10/19 (from the past 24 hour(s))  Urinalysis, Routine w reflex microscopic Urine, Clean Catch     Status: None   Collection Time: 08/10/19  1:05 PM  Result Value Ref Range   Color, Urine YELLOW YELLOW   APPearance CLEAR CLEAR   Specific Gravity, Urine 1.020 1.005 - 1.030   pH 6.0 5.0 - 8.0   Glucose, UA NEGATIVE NEGATIVE mg/dL   Hgb urine dipstick NEGATIVE NEGATIVE   Bilirubin Urine NEGATIVE NEGATIVE   Ketones, ur NEGATIVE NEGATIVE mg/dL   Protein,  ur NEGATIVE NEGATIVE mg/dL   Nitrite NEGATIVE NEGATIVE   Leukocytes,Ua NEGATIVE NEGATIVE  Wet prep, genital     Status: Abnormal   Collection Time: 08/10/19  2:00 PM   Specimen: Cervix  Result Value Ref Range   Yeast Wet Prep HPF POC NONE SEEN NONE SEEN   Trich, Wet Prep NONE SEEN NONE SEEN   Clue Cells Wet Prep HPF POC NONE SEEN NONE SEEN   WBC, Wet Prep HPF POC MODERATE (A) NONE SEEN   Sperm NONE SEEN   CBC     Status: Abnormal   Collection Time: 08/10/19  2:23 PM  Result Value Ref Range   WBC 13.1 (H) 4.0 - 10.5 K/uL   RBC 4.47 3.87 - 5.11 MIL/uL   Hemoglobin 12.4 12.0 - 15.0 g/dL   HCT 73.8 36 - 46 %   MCV 86.8 80.0 - 100.0 fL   MCH 27.7 26.0 - 34.0 pg   MCHC 32.0 30.0 - 36.0 g/dL   RDW 09.4 70.1 - 10.4 %   Platelets 214 150 - 400 K/uL   nRBC 0.0 0.0 - 0.2 %  Comprehensive metabolic panel     Status: Abnormal   Collection Time: 08/10/19  2:23 PM  Result Value Ref Range   Sodium 136 135 - 145 mmol/L   Potassium 4.3 3.5 - 5.1 mmol/L   Chloride 104 98 - 111 mmol/L   CO2 23 22 - 32 mmol/L   Glucose, Bld 66 (L) 70 - 99 mg/dL   BUN <5 (L) 6 - 20 mg/dL   Creatinine, Ser 3.81 0.44 - 1.00  mg/dL   Calcium 8.9 8.9 - 34.9 mg/dL   Total Protein 6.2 (L) 6.5 - 8.1 g/dL   Albumin 3.0 (L) 3.5 - 5.0 g/dL   AST 11 (L) 15 - 41 U/L   ALT 9 0 - 44 U/L   Alkaline Phosphatase 41 38 - 126 U/L   Total Bilirubin 0.5 0.3 - 1.2 mg/dL   GFR calc non Af Amer >60 >60 mL/min   GFR calc Af Amer >60 >60 mL/min   Anion gap 9 5 - 15    O/Positive/-- (07/20 0841)  IMAGING CT ABDOMEN PELVIS W CONTRAST  Result Date: 08/10/2019 CLINICAL DATA:  Right lower quadrant abdominal pain, rule out pregnant appendix EXAM: CT ABDOMEN AND PELVIS WITH CONTRAST TECHNIQUE: Multidetector CT imaging of the abdomen and pelvis was performed using the standard protocol following bolus administration of intravenous contrast. CONTRAST:  OMNIPAQUE IOHEXOL 300 MG/ML  SOLN COMPARISON:  None. FINDINGS: Lower chest: No acute abnormality. Hepatobiliary: No solid liver abnormality is seen. Hepatic steatosis. No gallstones, gallbladder wall thickening, or biliary dilatation. Pancreas: Unremarkable. No pancreatic ductal dilatation or surrounding inflammatory changes. Spleen: Normal in size without significant abnormality. Adrenals/Urinary Tract: Adrenal glands are unremarkable. Kidneys are normal, without renal calculi, solid lesion, or hydronephrosis. Bladder is unremarkable. Stomach/Bowel: Stomach is within normal limits. Appendix appears normal, located in the central abdomen adjacent to the right aspect of the uterine fundus, series 6, image 46) (series 3, image 42). No evidence of bowel wall thickening, distention, or inflammatory changes. Vascular/Lymphatic: No significant vascular findings are present. No enlarged abdominal or pelvic lymph nodes. Reproductive: Gravid uterus with single intrauterine gestation Other: No abdominal wall hernia or abnormality. No abdominopelvic ascites. Musculoskeletal: No acute or significant osseous findings. IMPRESSION: 1. No acute CT findings in the abdomen or pelvis to explain right lower quadrant  pain. Normal appendix. 2. Gravid uterus with single intrauterine gestation. 3.  Hepatic steatosis. Electronically Signed   By: Eddie Candle M.D.   On: 08/10/2019 19:01    MAU Management/MDM: Orders Placed This Encounter  Procedures  . Wet prep, genital  . Culture, Urine  . CT ABDOMEN PELVIS W CONTRAST  . Urinalysis, Routine w reflex microscopic Urine, Clean Catch  . CBC  . Comprehensive metabolic panel  . Diet NPO time specified  . Insert peripheral IV  . Discharge patient    Meds ordered this encounter  Medications  . acetaminophen (TYLENOL) tablet 650 mg  . iohexol (OMNIPAQUE) 9 MG/ML oral solution    Beiles, Edward   : cabinet override  . iohexol (OMNIPAQUE) 9 MG/ML oral solution 500 mL  . iohexol (OMNIPAQUE) 300 MG/ML solution 100 mL    Treatments in MAU included tylenol for RLQ pain.   Ordered tylenol for abdominal pain. Collected wet prep and GC/C on spec exam. Will also collect UCx to ensure no GU infections. Plan for CBC and CMP in addition to CT Abd Pelvis with contrast to r/o appendicitis. Prior to ordering imaging discussed the risks of radiation in pregnancy with pt. Confirmed no known allergies to contrast. Pt in agreement with plan.  1800: Pt arrived at CT.  1900: Per radiology, normal appendix on CT Abd Pelvis w constrast. Gravid uterus with single IUP. Hepatic steatosis. Discussed results of imaging with patient, including the incidental finding of hepatic steatosis. Counseled that hepatic steatosis should not cause complications in current pregnancy and is not the cause of current abdominal pain. However, PCP follow-up is recommended s/p delivery. Discussed likely etiology of RLQ pain as constipation vs viral GI illness.  Pt discharged with strict return precautions for worsening abdominal pain, persistent vomiting, inability to tolerate po intake, vaginal bleeding, or other concerns.  ASSESSMENT 1. RLQ abdominal pain: Pt presents given concern of sudden onset RLQ  pain rated 7/10 and worse with movement. Exam notable for exquisite tenderness over RLQ to palpation. No rebound or guarding. Pt afebrile with VSS. WBC elevated to 13. Given symptoms and exam findings, CT Abd Pelvis was performed and reassuringly, no evidence of appendicitis. Most likely etiology of constipation vs viral GI illness. -emphasized importance of good hydration (at least 8-12 cups) in addition to good fiber intake -recommended use of miralax as needed for constipation and zofran as needed for n/v -provided strict return precautions as noted below  2. Encounter for supervision of low-risk pregnancy in second trimester  -plan for next prenatal appt on 8/19  3. Obesity in pregnancy   4. Depression during pregnancy, antepartum   5. Anxiety: stable without medications  6. History of C-section  7. Hepatic steatosis: Incidental finding on CT today. -discussed findings with pt in MAU & encouraged PCP follow-up s/p delivery   PLAN Discharge home Allergies as of 08/10/2019      Reactions   Sulfur    Unknown rxn      Medication List    STOP taking these medications   amoxicillin 875 MG tablet Commonly known as: AMOXIL     TAKE these medications   acetaminophen 500 MG tablet Commonly known as: TYLENOL Take 2 tablets (1,000 mg total) by mouth every 8 (eight) hours as needed.   Blood Pressure Kit Devi 1 Device by Does not apply route as needed.   metoCLOPramide 10 MG tablet Commonly known as: REGLAN Take 1 tablet (10 mg total) by mouth every 6 (six) hours.   ondansetron 4 MG tablet Commonly known as: ZOFRAN Take 1 tablet (  4 mg total) by mouth every 8 (eight) hours as needed for nausea or vomiting.   polyethylene glycol powder 17 GM/SCOOP powder Commonly known as: GLYCOLAX/MIRALAX Take 17 g by mouth daily as needed.   PRENATAL VITAMIN PO Take 1 tablet by mouth daily.   promethazine 12.5 MG tablet Commonly known as: PHENERGAN Take 1 tablet (12.5 mg total) by mouth every  6 (six) hours as needed for nausea or vomiting.       Randa Ngo, MD OB Fellow, Faculty Practice 08/10/2019 7:38 PM

## 2019-08-10 NOTE — Discharge Instructions (Signed)
You were seen today for concern of abdominal pain. No concerns on your pelvic exam. No evidence of yeast or bacterial vaginosis.  Your white blood count was mildly elevated--this can occur in the setting of infection, but is a non-specific test. We performed a CT abdomen pelvis given concern of possible appendicitis given the nature of your pain. These results did not show any evidence of appendicitis. There was evidence of hepatic steatosis (fatty liver) on imaging. This should not affect the health of your baby in pregnancy, but it is recommended that you follow-up with your primary care physician after pregnancy.  Given a reassuringly CT scan, it is most likely that your abdominal discomfort is the result of constipation and abdominal gas. We recommend drinking plenty of water (8-12 cups daily). We also recommend plenty of fiber in your diet with fruits and vegetables. You may take tylenol as needed for pain. For constipation we recommend use of over-the-counter miralax daily as needed. We will contact you if your gonorrhea/chlamydia test results positive.  Abdominal Pain During Pregnancy  Abdominal pain is common during pregnancy, and has many possible causes. Some causes are more serious than others, and sometimes the cause is not known. Abdominal pain can be a sign that labor is starting. It can also be caused by normal growth and stretching of muscles and ligaments during pregnancy. Always tell your health care provider if you have any abdominal pain. Follow these instructions at home:  Do not have sex or put anything in your vagina until your pain goes away completely.  Get plenty of rest until your pain improves.  Drink enough fluid to keep your urine pale yellow.  Take over-the-counter and prescription medicines only as told by your health care provider.  Keep all follow-up visits as told by your health care provider. This is important. Contact a health care provider if:  Your pain  continues or gets worse after resting.  You have lower abdominal pain that: ? Comes and goes at regular intervals. ? Spreads to your back. ? Is similar to menstrual cramps.  You have pain or burning when you urinate. Get help right away if:  You have a fever or chills.  You have vaginal bleeding.  You are leaking fluid from your vagina.  You are passing tissue from your vagina.  You have vomiting or diarrhea that lasts for more than 24 hours.  Your baby is moving less than usual.  You feel very weak or faint.  You have shortness of breath.  You develop severe pain in your upper abdomen. Summary  Abdominal pain is common during pregnancy, and has many possible causes.  If you experience abdominal pain during pregnancy, tell your health care provider right away.  Follow your health care provider's home care instructions and keep all follow-up visits as directed. This information is not intended to replace advice given to you by your health care provider. Make sure you discuss any questions you have with your health care provider. Document Revised: 04/08/2018 Document Reviewed: 03/23/2016 Elsevier Patient Education  2020 [employer] Inc.   Fatty Liver Disease  Fatty liver disease occurs when too much fat has built up in your liver cells. Fatty liver disease is also called hepatic steatosis or steatohepatitis. The liver removes harmful substances from your bloodstream and produces fluids that your body needs. It also helps your body use and store energy from the food you eat. In many cases, fatty liver disease does not cause symptoms or problems. It  is often diagnosed when tests are being done for other reasons. However, over time, fatty liver can cause inflammation that may lead to more serious liver problems, such as scarring of the liver (cirrhosis) and liver failure. Fatty liver is associated with insulin resistance, increased body fat, high blood pressure (hypertension), and  high cholesterol. These are features of metabolic syndrome and increase your risk for stroke, diabetes, and heart disease. What are the causes? This condition may be caused by:  Drinking too much alcohol.  Poor nutrition.  Obesity.  Cushing's syndrome.  Diabetes.  High cholesterol.  Certain drugs.  Poisons.  Some viral infections.  Pregnancy. What increases the risk? You are more likely to develop this condition if you:  Abuse alcohol.  Are overweight.  Have diabetes.  Have hepatitis.  Have a high triglyceride level.  Are pregnant. What are the signs or symptoms? Fatty liver disease often does not cause symptoms. If symptoms do develop, they can include:  Fatigue.  Weakness.  Weight loss.  Confusion.  Abdominal pain.  Nausea and vomiting.  Yellowing of your skin and the white parts of your eyes (jaundice).  Itchy skin. How is this diagnosed? This condition may be diagnosed by:  A physical exam and medical history.  Blood tests.  Imaging tests, such as an ultrasound, CT scan, or MRI.  A liver biopsy. A small sample of liver tissue is removed using a needle. The sample is then looked at under a microscope. How is this treated? Fatty liver disease is often caused by other health conditions. Treatment for fatty liver may involve medicines and lifestyle changes to manage conditions such as:  Alcoholism.  High cholesterol.  Diabetes.  Being overweight or obese. Follow these instructions at home:   Do not drink alcohol. If you have trouble quitting, ask your health care provider how to safely quit with the help of medicine or a supervised program. This is important to keep your condition from getting worse.  Eat a healthy diet as told by your health care provider. Ask your health care provider about working with a diet and nutrition specialist (dietitian) to develop an eating plan.  Exercise regularly. This can help you lose weight and  control your cholesterol and diabetes. Talk to your health care provider about an exercise plan and which activities are best for you.  Take over-the-counter and prescription medicines only as told by your health care provider.  Keep all follow-up visits as told by your health care provider. This is important. Contact a health care provider if: You have trouble controlling your:  Blood sugar. This is especially important if you have diabetes.  Cholesterol.  Drinking of alcohol. Get help right away if:  You have abdominal pain.  You have jaundice.  You have nausea and vomiting.  You vomit blood or material that looks like coffee grounds.  You have stools that are black, tar-like, or bloody. Summary  Fatty liver disease develops when too much fat builds up in the cells of your liver.  Fatty liver disease often causes no symptoms or problems. However, over time, fatty liver can cause inflammation that may lead to more serious liver problems, such as scarring of the liver (cirrhosis).  You are more likely to develop this condition if you abuse alcohol, are pregnant, are overweight, have diabetes, have hepatitis, or have high triglyceride levels.  Contact your health care provider if you have trouble controlling your weight, blood sugar, cholesterol, or drinking of alcohol. This information is  not intended to replace advice given to you by your health care provider. Make sure you discuss any questions you have with your health care provider. Document Revised: 12/01/2016 Document Reviewed: 09/27/2016 Elsevier Patient Education  2020 [employer].

## 2019-08-11 ENCOUNTER — Encounter: Payer: Self-pay | Admitting: *Deleted

## 2019-08-11 LAB — GC/CHLAMYDIA PROBE AMP (~~LOC~~) NOT AT ARMC
Chlamydia: NEGATIVE
Comment: NEGATIVE
Comment: NORMAL
Neisseria Gonorrhea: NEGATIVE

## 2019-08-11 LAB — URINE CULTURE: Culture: NO GROWTH

## 2019-08-21 ENCOUNTER — Ambulatory Visit: Payer: Medicaid Other | Attending: Student

## 2019-08-21 ENCOUNTER — Other Ambulatory Visit: Payer: Self-pay

## 2019-08-21 ENCOUNTER — Encounter: Payer: Self-pay | Admitting: *Deleted

## 2019-08-21 ENCOUNTER — Ambulatory Visit: Payer: Medicaid Other | Admitting: *Deleted

## 2019-08-21 DIAGNOSIS — Z98891 History of uterine scar from previous surgery: Secondary | ICD-10-CM | POA: Insufficient documentation

## 2019-08-21 DIAGNOSIS — F419 Anxiety disorder, unspecified: Secondary | ICD-10-CM | POA: Insufficient documentation

## 2019-08-21 DIAGNOSIS — Z3A19 19 weeks gestation of pregnancy: Secondary | ICD-10-CM

## 2019-08-21 DIAGNOSIS — Z148 Genetic carrier of other disease: Secondary | ICD-10-CM | POA: Diagnosis not present

## 2019-08-21 DIAGNOSIS — F329 Major depressive disorder, single episode, unspecified: Secondary | ICD-10-CM | POA: Insufficient documentation

## 2019-08-21 DIAGNOSIS — E669 Obesity, unspecified: Secondary | ICD-10-CM

## 2019-08-21 DIAGNOSIS — Z3492 Encounter for supervision of normal pregnancy, unspecified, second trimester: Secondary | ICD-10-CM

## 2019-08-21 DIAGNOSIS — O34219 Maternal care for unspecified type scar from previous cesarean delivery: Secondary | ICD-10-CM | POA: Diagnosis not present

## 2019-08-21 DIAGNOSIS — O99210 Obesity complicating pregnancy, unspecified trimester: Secondary | ICD-10-CM | POA: Insufficient documentation

## 2019-08-21 DIAGNOSIS — Z3490 Encounter for supervision of normal pregnancy, unspecified, unspecified trimester: Secondary | ICD-10-CM | POA: Insufficient documentation

## 2019-08-21 DIAGNOSIS — O99340 Other mental disorders complicating pregnancy, unspecified trimester: Secondary | ICD-10-CM | POA: Diagnosis present

## 2019-08-21 DIAGNOSIS — O99212 Obesity complicating pregnancy, second trimester: Secondary | ICD-10-CM | POA: Diagnosis not present

## 2019-08-21 DIAGNOSIS — Z363 Encounter for antenatal screening for malformations: Secondary | ICD-10-CM

## 2019-08-21 DIAGNOSIS — F32A Depression, unspecified: Secondary | ICD-10-CM

## 2019-08-27 ENCOUNTER — Encounter: Payer: Self-pay | Admitting: Student

## 2019-08-27 DIAGNOSIS — D563 Thalassemia minor: Secondary | ICD-10-CM

## 2019-08-27 HISTORY — DX: Thalassemia minor: D56.3

## 2019-09-02 ENCOUNTER — Ambulatory Visit (INDEPENDENT_AMBULATORY_CARE_PROVIDER_SITE_OTHER): Payer: Medicaid Other | Admitting: Student

## 2019-09-02 ENCOUNTER — Other Ambulatory Visit: Payer: Self-pay

## 2019-09-02 VITALS — BP 122/73 | HR 80 | Wt 210.5 lb

## 2019-09-02 DIAGNOSIS — Z3492 Encounter for supervision of normal pregnancy, unspecified, second trimester: Secondary | ICD-10-CM

## 2019-09-02 DIAGNOSIS — Z3A20 20 weeks gestation of pregnancy: Secondary | ICD-10-CM

## 2019-09-02 DIAGNOSIS — D563 Thalassemia minor: Secondary | ICD-10-CM

## 2019-09-02 NOTE — Progress Notes (Signed)
   PRENATAL VISIT NOTE  Subjective:  Patty Smith is a 29 y.o. G2P1001 at [redacted]w[redacted]d being seen today for ongoing prenatal care.  She is currently monitored for the following issues for this low-risk pregnancy and has TOBACCO USER; DEPRESSION; BACK PAIN, LUMBAR, CHRONIC; Supervision of low-risk pregnancy; Obesity in pregnancy; Anxiety; History of C-section; Asymptomatic bacteriuria; and Alpha thalassemia silent carrier on their problem list.  Patient reports no complaints.  Contractions: Not present. Vag. Bleeding: None.  Movement: Present. Denies leaking of fluid.   The following portions of the patient's history were reviewed and updated as appropriate: allergies, current medications, past family history, past medical history, past social history, past surgical history and problem list.   Objective:   Vitals:   09/02/19 1331  BP: 122/73  Pulse: 80  Weight: 210 lb 8 oz (95.5 kg)    Fetal Status: Fetal Heart Rate (bpm): 164   Movement: Present    Fundal height at umbilicus  General:  Alert, oriented and cooperative. Patient is in no acute distress.  Skin: Skin is warm and dry. No rash noted.   Cardiovascular: Normal heart rate noted  Respiratory: Normal respiratory effort, no problems with respiration noted  Abdomen: Soft, gravid, appropriate for gestational age.  Pain/Pressure: Absent     Pelvic: Cervical exam deferred        Extremities: Normal range of motion.  Edema: None  Mental Status: Normal mood and affect. Normal behavior. Normal judgment and thought content.   Assessment and Plan:  Pregnancy: G2P1001 at [redacted]w[redacted]d 1. Encounter for supervision of low-risk pregnancy in second trimester -Doing better since MAU visit. Managing constipation with stool softeners & switched out her PNV to one without as much iron in it  -Still haven't received delivery records from her ob in [place], will sign another release. Will plan to have in person visit with MD at 28 wks to discuss plan for delivery.    - AFP, Serum, Open Spina Bifida  2. Alpha thalassemia silent carrier -Will have FOB tested  3. [redacted] weeks gestation of pregnancy   Preterm labor symptoms and general obstetric precautions including but not limited to vaginal bleeding, contractions, leaking of fluid and fetal movement were reviewed in detail with the patient. Please refer to After Visit Summary for other counseling recommendations.   Return in about 4 weeks (around 09/30/2019) for Routine OB, virtual.  Future Appointments  Date Time Provider Department Center  10/01/2019  3:55 PM Armando Reichert, CNM [facility]    Judeth Horn, NP

## 2019-09-02 NOTE — Patient Instructions (Signed)

## 2019-09-04 LAB — AFP, SERUM, OPEN SPINA BIFIDA
AFP MoM: 1.87
AFP Value: 103.8 ng/mL
Gest. Age on Collection Date: 20.5 weeks
Maternal Age At EDD: 31.6 yr
OSBR Risk 1 IN: 2166
Test Results:: NEGATIVE
Weight: 210 [lb_av]

## 2019-09-16 ENCOUNTER — Telehealth (INDEPENDENT_AMBULATORY_CARE_PROVIDER_SITE_OTHER): Payer: Medicaid Other

## 2019-09-16 DIAGNOSIS — Z3492 Encounter for supervision of normal pregnancy, unspecified, second trimester: Secondary | ICD-10-CM

## 2019-09-16 NOTE — Telephone Encounter (Signed)
Pt left VM on nurse line requesting a note for work. Pt states she is having pain in hips and pelvic pressure and is concerned this is related to working 5 days/week. Pt would like a note stating she cannot be scheduled 5 days a week.

## 2019-09-17 ENCOUNTER — Other Ambulatory Visit: Payer: Self-pay

## 2019-09-17 ENCOUNTER — Encounter (HOSPITAL_COMMUNITY): Payer: Self-pay | Admitting: Obstetrics & Gynecology

## 2019-09-17 ENCOUNTER — Inpatient Hospital Stay (HOSPITAL_COMMUNITY)
Admission: AD | Admit: 2019-09-17 | Discharge: 2019-09-17 | Disposition: A | Discharge status: Home / Self Care | Payer: Medicaid Other | Attending: Obstetrics & Gynecology | Admitting: Obstetrics & Gynecology

## 2019-09-17 DIAGNOSIS — R102 Pelvic and perineal pain: Secondary | ICD-10-CM | POA: Insufficient documentation

## 2019-09-17 DIAGNOSIS — Z79899 Other long term (current) drug therapy: Secondary | ICD-10-CM | POA: Diagnosis not present

## 2019-09-17 DIAGNOSIS — Z87440 Personal history of urinary (tract) infections: Secondary | ICD-10-CM | POA: Diagnosis not present

## 2019-09-17 DIAGNOSIS — F329 Major depressive disorder, single episode, unspecified: Secondary | ICD-10-CM | POA: Diagnosis not present

## 2019-09-17 DIAGNOSIS — U071 COVID-19: Secondary | ICD-10-CM | POA: Insufficient documentation

## 2019-09-17 DIAGNOSIS — Z87891 Personal history of nicotine dependence: Secondary | ICD-10-CM | POA: Insufficient documentation

## 2019-09-17 DIAGNOSIS — O98512 Other viral diseases complicating pregnancy, second trimester: Secondary | ICD-10-CM | POA: Insufficient documentation

## 2019-09-17 DIAGNOSIS — O26892 Other specified pregnancy related conditions, second trimester: Secondary | ICD-10-CM | POA: Diagnosis not present

## 2019-09-17 DIAGNOSIS — O98513 Other viral diseases complicating pregnancy, third trimester: Secondary | ICD-10-CM

## 2019-09-17 DIAGNOSIS — O99342 Other mental disorders complicating pregnancy, second trimester: Secondary | ICD-10-CM | POA: Diagnosis not present

## 2019-09-17 DIAGNOSIS — Z3A22 22 weeks gestation of pregnancy: Secondary | ICD-10-CM | POA: Insufficient documentation

## 2019-09-17 HISTORY — DX: COVID-19: U07.1

## 2019-09-17 HISTORY — DX: Unspecified infectious disease: B99.9

## 2019-09-17 LAB — URINALYSIS, ROUTINE W REFLEX MICROSCOPIC
Bilirubin Urine: NEGATIVE
Glucose, UA: NEGATIVE mg/dL
Hgb urine dipstick: NEGATIVE
Ketones, ur: NEGATIVE mg/dL
Leukocytes,Ua: NEGATIVE
Nitrite: NEGATIVE
Protein, ur: NEGATIVE mg/dL
Specific Gravity, Urine: 1.02 (ref 1.005–1.030)
pH: 7 (ref 5.0–8.0)

## 2019-09-17 LAB — BASIC METABOLIC PANEL
Anion gap: 6 (ref 5–15)
BUN: 5 mg/dL — ABNORMAL LOW (ref 6–20)
CO2: 25 mmol/L (ref 22–32)
Calcium: 8.5 mg/dL — ABNORMAL LOW (ref 8.9–10.3)
Chloride: 104 mmol/L (ref 98–111)
Creatinine, Ser: 0.54 mg/dL (ref 0.44–1.00)
GFR calc Af Amer: >60 mL/min (ref >60)
GFR calc non Af Amer: >60 mL/min (ref >60)
Glucose, Bld: 93 mg/dL (ref 70–99)
Potassium: 4.2 mmol/L (ref 3.5–5.1)
Sodium: 135 mmol/L (ref 135–145)

## 2019-09-17 LAB — CBC WITH DIFFERENTIAL/PLATELET
Abs Immature Granulocytes: 0.05 10*3/uL (ref 0.00–0.07)
Basophils Absolute: 0 10*3/uL (ref 0.0–0.1)
Basophils Relative: 0 %
Eosinophils Absolute: 0 10*3/uL (ref 0.0–0.5)
Eosinophils Relative: 1 %
HCT: 37.7 % (ref 36.0–46.0)
Hemoglobin: 11.9 g/dL — ABNORMAL LOW (ref 12.0–15.0)
Immature Granulocytes: 1 %
Lymphocytes Relative: 22 %
Lymphs Abs: 1.1 10*3/uL (ref 0.7–4.0)
MCH: 28.1 pg (ref 26.0–34.0)
MCHC: 31.6 g/dL (ref 30.0–36.0)
MCV: 89.1 fL (ref 80.0–100.0)
Monocytes Absolute: 0.6 10*3/uL (ref 0.1–1.0)
Monocytes Relative: 12 %
Neutro Abs: 3.1 10*3/uL (ref 1.7–7.7)
Neutrophils Relative %: 64 %
Platelets: 192 10*3/uL (ref 150–400)
RBC: 4.23 MIL/uL (ref 3.87–5.11)
RDW: 13.1 % (ref 11.5–15.5)
WBC: 4.8 10*3/uL (ref 4.0–10.5)
nRBC: 0 % (ref 0.0–0.2)

## 2019-09-17 LAB — SARS CORONAVIRUS 2 BY RT PCR (HOSPITAL ORDER, PERFORMED IN ~~LOC~~ HOSPITAL LAB): SARS Coronavirus 2: POSITIVE — AB

## 2019-09-17 MED ORDER — CYCLOBENZAPRINE HCL 5 MG PO TABS
10.0000 mg | ORAL_TABLET | Freq: Once | ORAL | Status: AC
  Administered 2019-09-17: 10 mg via ORAL
  Filled 2019-09-17: qty 2

## 2019-09-17 MED ORDER — PSEUDOEPHEDRINE HCL 30 MG PO TABS
60.0000 mg | ORAL_TABLET | Freq: Once | ORAL | Status: AC
  Administered 2019-09-17: 60 mg via ORAL
  Filled 2019-09-17: qty 2

## 2019-09-17 MED ORDER — DIPHENHYDRAMINE HCL 50 MG/ML IJ SOLN: 50.0000 mg | Freq: Once | INTRAMUSCULAR | Status: DC | PRN

## 2019-09-17 MED ORDER — SODIUM CHLORIDE 0.9 % IV SOLN: INTRAVENOUS | Status: DC | PRN

## 2019-09-17 MED ORDER — EPINEPHRINE 0.3 MG/0.3ML IJ SOAJ
0.3000 mg | Freq: Once | INTRAMUSCULAR | Status: DC | PRN
  Filled 2019-09-17: qty 0.6

## 2019-09-17 MED ORDER — CYCLOBENZAPRINE HCL 10 MG PO TABS: 10.0000 mg | ORAL_TABLET | Freq: Two times a day (BID) | ORAL | 0 refills | Status: DC | PRN

## 2019-09-17 MED ORDER — ALBUTEROL SULFATE HFA 108 (90 BASE) MCG/ACT IN AERS
2.0000 | INHALATION_SPRAY | Freq: Once | RESPIRATORY_TRACT | Status: AC | PRN
  Administered 2019-09-17: 2 via RESPIRATORY_TRACT
  Filled 2019-09-17: qty 6.7

## 2019-09-17 MED ORDER — FAMOTIDINE IN NACL 20-0.9 MG/50ML-% IV SOLN: 20.0000 mg | Freq: Once | INTRAVENOUS | Status: DC | PRN

## 2019-09-17 MED ORDER — SODIUM CHLORIDE 0.9 % IV SOLN
1200.0000 mg | Freq: Once | INTRAVENOUS | Status: DC
  Filled 2019-09-17 (×2): qty 10

## 2019-09-17 MED ORDER — METHYLPREDNISOLONE SODIUM SUCC 125 MG IJ SOLR: 125.0000 mg | Freq: Once | INTRAMUSCULAR | Status: DC | PRN

## 2019-09-17 NOTE — Discharge Instructions (Signed)
Pregnancy and COVID-19 Coronavirus disease, also called COVID-19, is an infection of the lungs and airways (respiratory tract). It is unclear at this time if pregnancy makes it more likely for you to get COVID-19, or what effects the infection may have on your unborn baby. However, pregnancy causes changes to your heart, lungs, and your body's disease-fighting system (immune system). Some of these changes make it more likely for you to get sick and have more serious illness. Therefore, it is important for you to take precautions in order to protect yourself and your unborn baby. There have been studies showing that obesity and diabetes may put you at higher risk for serious illness. If you are pregnant and are obese or have diabetes, you should take extra precautions to protect yourself from the virus. Work with your health care team to develop a plan to protect yourself from all infections, including COVID-19. This is one way for you to stay healthy during your pregnancy and to keep your baby healthy as well. How does this affect me? If you get COVID-19, there is a risk that you may:  Get a respiratory illness that can lead to pneumonia.  Give birth to your baby before 37 weeks of pregnancy (premature birth). If you have or may have COVID-19, your health care provider may recommend special precautions around your pregnancy. This may affect how you:  Receive care before delivery (prenatal care). How you visit your health care provider may change. Tests and scans may need to be performed differently.  Receive care during labor and delivery. This may affect your birth plan, including who may be with you during labor and delivery.  Receive care after you deliver your baby (postpartum care). You may stay longer in the hospital and in a special room.  Feed your baby after he or she is born. Pregnancy can be an especially stressful time because of the changes in your body and the preparation involved in  becoming a parent. In addition, you may be feeling especially fearful, anxious, or stressed because of COVID-19 and how it is affecting you. How does this affect my baby? It is not known whether a mother will transmit the virus to her unborn baby. There is a risk that if you get COVID-19:  The virus that causes COVID-19 can pass to your baby.  You may have premature birth. Your baby may require more medical care if this happens. What can I do to lower my risk?  There is no vaccine to help prevent COVID-19. However, there are actions that you can take to protect yourself and others from this virus. Cleaning and personal hygiene  Wash your hands often with soap and water for at least 20 seconds. If soap and water are not available, use alcohol-based hand sanitizer.  Avoid touching your mouth, face, eyes, or nose.  Clean and disinfect objects and surfaces that are frequently touched every day. These may include: ? Counters and tables. ? Doorknobs and light switches. ? Sinks and faucets. ? Electronics such as phones, remote controls, keyboards, computers, and tablets. Stay away from others  Stay away from people who are sick, if possible.  Avoid social gatherings and travel.  Stay home as much as possible. Follow these instructions: Breastfeeding It is not known if the virus that causes COVID-19 can pass through breast milk to your baby. You should make a plan for feeding your infant with your family and your health care team. If you have or may have COVID-19,  your health care provider may recommend that you take precautions while breastfeeding, such as:  Washing your hands before feeding your baby.  Wearing a mask while feeding your baby.  Pumping or expressing breast milk to feed to your baby. If possible, ask someone in your household who is not sick to feed your baby the expressed breast milk. ? Wash your hands before touching pump parts. ? Wash and disinfect all pump parts  after expressing milk. Follow the manufacturer's instructions to clean and disinfect all pump parts. General instructions  If you think you have a COVID-19 infection, contact your health care provider right away. Tell your health care provider that you think you may have a COVID-19 infection.  Follow your health care provider's instructions on taking medicines. Some medicines may be unsafe to take during pregnancy.  Cover your mouth and nose by wearing a mask or other cloth covering over your face when you go out in public.  Find ways to manage stress. These may include: ? Using relaxation techniques like meditation and deep breathing. ? Getting regular exercise. Most women can continue their usual exercise routine during pregnancy. Ask your health care provider what activities are safe for you. ? Seeking support from family, friends, or spiritual resources. If you cannot be together in person, you can still connect by phone calls, texts, video calls, or online messaging. ? Spending time doing relaxing activities that you enjoy, like listening to music or reading a good book.  Ask for help if you have counseling or nutritional needs during pregnancy. Your health care provider can offer advice or refer you to resources or specialists who can help you with various needs.  Keep all follow-up visits as told by your health care provider. This is important. Where to find more information Centers for Disease Control and Prevention (CDC): http://gutierrez-robinson.com/ World Health Organization [facility]): https://www.morales.com/ [employer]necologists (ACOG): StickerEmporium.tn Questions to ask your health care team  What should I do if I have COVID-19 symptoms?  How will COVID-19 affect my prenatal care visits, tests and scans, labor and  delivery, and postpartum care?  Should I plan to breastfeed my baby?  Where can I find mental health resources?  Where can I find support if I have financial concerns? Contact a health care provider if:  You have signs and symptoms of infection, including a fever or cough. Tell your health care team that you think you may have a COVID-19 infection.  You have strong emotions, such as sadness or anxiety.  You feel unsafe in your home and need help finding a safe place to live.  You have bloody or watery vaginal discharge or vaginal bleeding. Get help right away if:  You have signs or symptoms of labor before 37 weeks of pregnancy. These include: ? Contractions that are 5 minutes or less apart, or that increase in frequency, intensity, or length. ? Sudden, sharp pain in the abdomen or in the lower back. ? A gush or trickle of fluid from your vagina.  You have signs of more serious illness such as: ? You have difficulty breathing. ? You have chest pain. ? You have a fever greater than 102F (39C) or higher that does not go away. ? You cannot drink fluids without vomiting. ? You feel extremely weak or you faint. These symptoms may represent a serious problem that is an emergency. Do not wait to see if the symptoms will go away. Get medical help right away. Call  your local emergency services (911 in the [place] Do not drive yourself to the hospital. Summary  Coronavirus disease, also called COVID-19, is an infection of the lungs and airways (respiratory tract). It is unclear at this time if pregnancy makes you more susceptible to COVID-19 and what effects it may have on unborn babies.  It is important to take precautions to protect yourself and your developing baby. This includes washing your hands often, avoiding touching your mouth, face, eyes, or nose, avoiding social gatherings and travel, and staying away from people who are sick.  If you think you have a COVID-19 infection,  contact your health care provider right away. Tell your health care provider that you think you may have a COVID-19 infection.  If you have or may have COVID-19, your health care provider may recommend special precautions during your pregnancy, labor and delivery, and after your baby is born. This information is not intended to replace advice given to you by your health care provider. Make sure you discuss any questions you have with your health care provider. Document Revised: 10/11/2018 Document Reviewed: 04/16/2018 Elsevier Patient Education  2020 [employer]. Safe Medications in Pregnancy   Acne: Benzoyl Peroxide Salicylic Acid  Backache/Headache: Tylenol: 2 regular strength every 4 hours OR              2 Extra strength every 6 hours  Colds/Coughs/Allergies: Benadryl (alcohol free) 25 mg every 6 hours as needed Breath right strips Claritin Cepacol throat lozenges Chloraseptic throat spray Cold-Eeze- up to three times per day Cough drops, alcohol free Flonase (by prescription only) Guaifenesin Mucinex Robitussin DM (plain only, alcohol free) Saline nasal spray/drops Sudafed (pseudoephedrine) & Actifed ** use only after [redacted] weeks gestation and if you do not have high blood pressure Tylenol Vicks Vaporub Zinc lozenges Zyrtec   Constipation: Colace Ducolax suppositories Fleet enema Glycerin suppositories Metamucil Milk of magnesia Miralax Senokot Smooth move tea  Diarrhea: Kaopectate Imodium A-D  *NO pepto Bismol  Hemorrhoids: Anusol Anusol HC Preparation H Tucks  Indigestion: Tums Maalox Mylanta Zantac  Pepcid  Insomnia: Benadryl (alcohol free) 25mg  every 6 hours as needed Tylenol PM Unisom, no Gelcaps  Leg Cramps: Tums MagGel  Nausea/Vomiting:  Bonine Dramamine Emetrol Ginger extract Sea bands Meclizine  Nausea medication to take during pregnancy:  Unisom (doxylamine succinate 25 mg tablets) Take one tablet daily at bedtime. If  symptoms are not adequately controlled, the dose can be increased to a maximum recommended dose of two tablets daily (1/2 tablet in the morning, 1/2 tablet mid-afternoon and one at bedtime). Vitamin B6 100mg  tablets. Take one tablet twice a day (up to 200 mg per day).  Skin Rashes: Aveeno products Benadryl cream or 25mg  every 6 hours as needed Calamine Lotion 1% cortisone cream  Yeast infection: Gyne-lotrimin 7 Monistat 7  Gum/tooth pain: Anbesol  **If taking multiple medications, please check labels to avoid duplicating the same active ingredients **take medication as directed on the label ** Do not exceed 4000 mg of tylenol in 24 hours **Do not take medications that contain aspirin or ibuprofen

## 2019-09-17 NOTE — MAU Note (Signed)
Started 2-3 days started feeling really congested (sinus).  Went to work yesterday, by the time she got home, she was feeling a whole lot of pressure behind her eyes.  Past 16hrs has had a headache(frontal).  Been taking Tylenol for HA, helps enough to go to sleep, but wakes up with HA.  Feels like there is water in her right ear, "hears waves", not hurting.   Can't taste.

## 2019-09-17 NOTE — MAU Provider Note (Signed)
History     CSN: [id number]  Arrival date and time: 09/17/19 4818   First Provider Initiated Contact with Patient 09/17/19 1000      Chief Complaint  Patient presents with  . Headache  . Otalgia  . sinus congestion   Patty Smith is a 28 y.o. G2P1001 at 25w6dwho [facility]  She presents today for Headache, Otalgia, and sinus congestion.  She states she has been experiencing her HA symptoms for the past 16 hours.  Patient states she has been taking tylenol with minimal relief of her symptoms. She states the HA is located "behind my eyes" and patient rates it a 8/10.  She reports the pain is increased with movement.  She states she the congestion started Sunday and contributed it to changes in weather.  Patient also reports she has been having right ear "constant sound of waves."  She denies pain in her ears. She states her son was sick 2 weeks ago and they both were tested for Covid and were negative.    OB History    Gravida  2   Para  1   Term  1   Preterm      AB      Living  1     SAB      TAB      Ectopic      Multiple      Live Births  1           Past Medical History:  Diagnosis Date  . Anxiety   . Complication of anesthesia    epidural /or spinal didin't work- stuck many times  . Depression    doing ok  . Endometriosis    not officially diagnosed by surgery or [place] prior MD treated with ocp's   . Infection    UTI  . Scarlet fever 2001  . Viral meningitis     Past Surgical History:  Procedure Laterality Date  . CESAREAN SECTION      Family History  Problem Relation Age of Onset  . Heart failure Father   . Hypertension Father   . Diabetes Father   . Anxiety disorder Father   . Endometriosis Mother   . Depression Mother   . COPD Mother   . Anxiety disorder Mother     Social History   Tobacco Use  . Smoking status: Former Smoker    Packs/day: 0.30    Types: Cigarettes    Quit date: 05/05/2019    Years since  quitting: 0.3  . Smokeless tobacco: Never Used  Vaping Use  . Vaping Use: Never used  Substance Use Topics  . Alcohol use: Not Currently    Comment: Occ  . Drug use: Not Currently    Types: Marijuana    Comment: last use was 05/05/2019    Allergies:  Allergies  Allergen Reactions  . Sulfur     Unknown rxn    Medications Prior to Admission  Medication Sig Dispense Refill Last Dose  . metoCLOPramide (REGLAN) 10 MG tablet Take 1 tablet (10 mg total) by mouth every 6 (six) hours. 20 tablet 0 Past Week at Unknown time  . Prenatal Vit-Fe Fumarate-FA (PRENATAL VITAMIN PO) Take 1 tablet by mouth daily.   Past Week at Unknown time  . acetaminophen (TYLENOL) 500 MG tablet Take 2 tablets (1,000 mg total) by mouth every 8 (eight) hours as needed. 30 tablet 0  at 1930  . Blood Pressure Monitoring (  BLOOD PRESSURE KIT) DEVI 1 Device by Does not apply route as needed. (Patient not taking: Reported on 07/22/2019) 1 each 0   . ondansetron (ZOFRAN) 4 MG tablet Take 1 tablet (4 mg total) by mouth every 8 (eight) hours as needed for nausea or vomiting. (Patient not taking: Reported on 05/29/2019) 16 tablet 0 More than a month at Unknown time  . polyethylene glycol powder (GLYCOLAX/MIRALAX) 17 GM/SCOOP powder Take 17 g by mouth daily as needed. 510 g 1 More than a month at Unknown time  . promethazine (PHENERGAN) 12.5 MG tablet Take 1 tablet (12.5 mg total) by mouth every 6 (six) hours as needed for nausea or vomiting. 30 tablet 0 More than a month at Unknown time    Review of Systems  Constitutional: Negative for chills and fever.  HENT: Positive for congestion, ear pain (Wave sound), sinus pressure and sinus pain. Negative for rhinorrhea and sore throat.   Eyes: Positive for photophobia. Negative for visual disturbance.  Respiratory: Negative for cough, chest tightness and shortness of breath.   Cardiovascular: Negative for chest pain.  Gastrointestinal: Positive for nausea. Negative for abdominal pain  and vomiting.  Genitourinary: Positive for pelvic pain. Negative for difficulty urinating, dysuria, vaginal bleeding and vaginal discharge.  Musculoskeletal: Negative for back pain.  Neurological: Positive for dizziness, light-headedness and headaches.   Physical Exam   Blood pressure 125/69, pulse 88, temperature 98.1 F (36.7 C), temperature source Oral, resp. rate 18, height 5' 5"  (1.651 m), weight 96.6 kg, last menstrual period 04/03/2019, SpO2 98 %.  Physical Exam Vitals reviewed.  Constitutional:      General: She is not in acute distress.    Appearance: She is well-developed.  HENT:     Head: Normocephalic and atraumatic.     Nose: Congestion present.  Eyes:     Conjunctiva/sclera: Conjunctivae normal.  Cardiovascular:     Rate and Rhythm: Normal rate and regular rhythm.     Heart sounds: Normal heart sounds.  Pulmonary:     Effort: Pulmonary effort is normal. No respiratory distress.     Breath sounds: Normal breath sounds.  Abdominal:     General: Bowel sounds are normal. There is no distension.     Palpations: Abdomen is soft.     Tenderness: There is no abdominal tenderness.  Musculoskeletal:     Cervical back: Normal range of motion.  Skin:    General: Skin is warm and moist.  Neurological:     Mental Status: She is alert and oriented to person, place, and time.  Psychiatric:        Mood and Affect: Mood normal.        Behavior: Behavior normal.     MAU Course  Procedures Results for orders placed or performed during the hospital encounter of 09/17/19 (from the past 24 hour(s))  Urinalysis, Routine w reflex microscopic     Status: Abnormal   Collection Time: 09/17/19  9:05 AM  Result Value Ref Range   Color, Urine YELLOW YELLOW   APPearance HAZY (A) CLEAR   Specific Gravity, Urine 1.020 1.005 - 1.030   pH 7.0 5.0 - 8.0   Glucose, UA NEGATIVE NEGATIVE mg/dL   Hgb urine dipstick NEGATIVE NEGATIVE   Bilirubin Urine NEGATIVE NEGATIVE   Ketones, ur  NEGATIVE NEGATIVE mg/dL   Protein, ur NEGATIVE NEGATIVE mg/dL   Nitrite NEGATIVE NEGATIVE   Leukocytes,Ua NEGATIVE NEGATIVE  SARS Coronavirus 2 by RT PCR (hospital order, performed in [facility] hospital lab)  Nasopharyngeal Nasopharyngeal Swab     Status: Abnormal   Collection Time: 09/17/19  9:48 AM   Specimen: Nasopharyngeal Swab  Result Value Ref Range   SARS Coronavirus 2 POSITIVE (A) NEGATIVE  CBC with Differential/Platelet     Status: Abnormal   Collection Time: 09/17/19  9:49 AM  Result Value Ref Range   WBC 4.8 4.0 - 10.5 K/uL   RBC 4.23 3.87 - 5.11 MIL/uL   Hemoglobin 11.9 (L) 12.0 - 15.0 g/dL   HCT 37.7 36 - 46 %   MCV 89.1 80.0 - 100.0 fL   MCH 28.1 26.0 - 34.0 pg   MCHC 31.6 30.0 - 36.0 g/dL   RDW 13.1 11.5 - 15.5 %   Platelets 192 150 - 400 K/uL   nRBC 0.0 0.0 - 0.2 %   Neutrophils Relative % 64 %   Neutro Abs 3.1 1.7 - 7.7 K/uL   Lymphocytes Relative 22 %   Lymphs Abs 1.1 0.7 - 4.0 K/uL   Monocytes Relative 12 %   Monocytes Absolute 0.6 0 - 1 K/uL   Eosinophils Relative 1 %   Eosinophils Absolute 0.0 0 - 0 K/uL   Basophils Relative 0 %   Basophils Absolute 0.0 0 - 0 K/uL   Immature Granulocytes 1 %   Abs Immature Granulocytes 0.05 0.00 - 0.07 K/uL  Basic metabolic panel     Status: Abnormal   Collection Time: 09/17/19  9:49 AM  Result Value Ref Range   Sodium 135 135 - 145 mmol/L   Potassium 4.2 3.5 - 5.1 mmol/L   Chloride 104 98 - 111 mmol/L   CO2 25 22 - 32 mmol/L   Glucose, Bld 93 70 - 99 mg/dL   BUN 5 (L) 6 - 20 mg/dL   Creatinine, Ser 0.54 0.44 - 1.00 mg/dL   Calcium 8.5 (L) 8.9 - 10.3 mg/dL   GFR calc non Af Amer >60 >60 mL/min   GFR calc Af Amer >60 >60 mL/min   Anion gap 6 5 - 15   No results found.  MDM Education Labs: Covid, CBC with Diff, BMP Decongestant Muscle Relaxant Assessment and Plan  29 year old, G2P1001  SIUP at 22.6 weeks Headache Congestion Pelvic Pain  -Reviewed POC with patient. -Exam performed and findings  discussed.  -Informed that will perform Covid test and labs -Will treat nasal congestion with sudafed and give flexeril for pain -Will await results and reassess.   Fraser Din Patty Smith 09/17/2019, 10:00 AM   Reassessment (11:22 AM) Covid Positive  -Provider to bedside to report results. -Patient questions and concerns addressed. -Discussed infusion of mAB for symptoms. -Educated on need to quarantine and have some removed from school. -Encouraged to have family members tested as appropriate. -Patient verbalizes understanding and agreeable to infusion. -Orders placed per protocol.    Reassessment (12:01 PM)  -Nurse comes out and reports patient needs to leave to pick up son. -Nurse informed that [place] papers do not need to be given as patient has the right to refuse mAB infusion. -Provider informs patient that she can receive treatment in outpatient facility and message will be sent to staff for scheduling accordingly. -Patient apologetic and states that she does not want to be non-complaint, but has to pick up her son immediately. -Patient gave reassurances.  -Patient requests and given note for work. -Rx for Flexeril sent to pharmacy on file.  -Given written information regarding quarantine. -Message sent to [facility] to call patient and schedule infusion as patient  schedule allows. -Encouraged to call or return to MAU if symptoms worsen or with the onset of new symptoms. -Discharged to home in stable condition.  Maryann Conners MSN, CNM Advanced Practice Provider, [facility] [employer] [facility]

## 2019-09-17 NOTE — Progress Notes (Signed)
Pharmacy COVID-19 Monoclonal Antibody Screening  Patty Smith was identified as being not hospitalized with symptoms from Covid-19 on admission but an incidental positive PCR has been documented.  The patient may qualify for the use of monoclonal antibodies (mAB) for COVID-19 viral infection to prevent worsening symptoms stemming from Covid-19 infection.  The patient was identified based on a positive COVID-19 PCR and not requiring the use of supplemental oxygen at this time.  This patient meets the FDA criteria for Emergency Use Authorization of casirivimab/imdevimab.  Has a (+) direct SARS-CoV-2 viral test result  Is NOT hospitalized due to COVID-19  Is within 10 days of symptom onset  Has at least one of the high risk factor(s) for progression to severe COVID-19 and/or hospitalization as defined in EUA.  Specific high risk criteria : BMI > 25 and Pregnancy  Additionally: The patient has not had a positive COVID-19 PCR in the last 90 days.  The patient is unvaccinated against COVID-19.  Since the patient is unvaccinated and meets high risk criteria, the patient is eligible for mAB administration.   This eligibility and indication for treatment was discussed with the patient's physician: Patty Smith, Patty Smith  Plan: Based on the above discussion, it was decided that the patient will receive one dose of mAB combination.Casirivimab/imdevimab has been ordered. Pharmacy will coordinate administration timing with patient's nurse. Recommended infusion monitoring parameters communicated to the nursing team.   Patty Smith 09/17/2019  12:15 PM

## 2019-09-19 ENCOUNTER — Encounter: Payer: Self-pay | Admitting: Oncology

## 2019-09-19 ENCOUNTER — Other Ambulatory Visit: Payer: Self-pay | Admitting: Oncology

## 2019-09-19 DIAGNOSIS — U071 COVID-19: Secondary | ICD-10-CM

## 2019-09-19 NOTE — Telephone Encounter (Signed)
Called patient back and she reports that she has been scheduled for the infusion tomorrow at 10:30 am.   She has no other questions or concerns at this time.

## 2019-09-19 NOTE — Progress Notes (Signed)
I connected by phone with  Patty Smith  to discuss the potential use of an new treatment for mild to moderate COVID-19 viral infection in non-hospitalized patients.   This patient is a age/sex that meets the FDA criteria for Emergency Use Authorization of casirivimab\imdevimab.  Has a (+) direct SARS-CoV-2 viral test result 1. Has mild or moderate COVID-19  2. Is ? 31 years of age and weighs ? 40 kg 3. Is NOT hospitalized due to COVID-19 4. Is NOT requiring oxygen therapy or requiring an increase in baseline oxygen flow rate due to COVID-19 5. Is within 10 days of symptom onset 6. Has at least one of the high risk factor(s) for progression to severe COVID-19 and/or hospitalization as defined in EUA. Specific high risk criteria : Past Medical History:  Diagnosis Date  . Anxiety   . Complication of anesthesia    epidural /or spinal didin't work- stuck many times  . Depression    doing ok  . Endometriosis    not officially diagnosed by surgery or [place] but prior MD treated with ocp's   . Infection    UTI  . Scarlet fever 2001  . Viral meningitis   ?  5 months pregnant.    Symptom onset  09/14/2019   I have spoken and communicated the following to the patient or parent/caregiver:   1. FDA has authorized the emergency use of casirivimab\imdevimab for the treatment of mild to moderate COVID-19 in adults and pediatric patients with positive results of direct SARS-CoV-2 viral testing who are 32 years of age and older weighing at least 40 kg, and who are at high risk for progressing to severe COVID-19 and/or hospitalization.   2. The significant known and potential risks and benefits of casirivimab\imdevimab, and the extent to which such potential risks and benefits are unknown.   3. Information on available alternative treatments and the risks and benefits of those alternatives, including clinical trials.   4. Patients treated with casirivimab\imdevimab should continue to self-isolate and use  infection control measures (e.g., wear mask, isolate, social distance, avoid sharing personal items, clean and disinfect "high touch" surfaces, and frequent handwashing) according to CDC guidelines.    5. The patient or parent/caregiver has the option to accept or refuse casirivimab\imdevimab .   After reviewing this information with the patient, The patient agreed to proceed with receiving casirivimab\imdevimab infusion and will be provided a copy of the Fact sheet prior to receiving the infusion.Mignon Pine, AGNP-C [phone] (Infusion Center Hotline)

## 2019-09-19 NOTE — Telephone Encounter (Addendum)
Returned patients call. Patient reports she is tired and congested. She is in quarantine at home.   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 She does not have a fax number to send it to. She is asking for it to be emailed to her. Discussed we usually do not email anything. Verbal Consent obtained with Rolan Lipa and results printed to be mailed to patient as she is not able to come to the office to sign ROI. Results mailed to patient by front office.   She reports she had a fractured pelvis at her last delivery and is concerned with pelvic pressure and pain that she is feeling.   She is home currently due to Covid and will speak with Provider at next visit about work schedule. She is concerned that she is being scheduled 5 days a week.   Patient reports she has been trying to call the Covid hotline to get scheduled for the Covid Antibody infusion. Spoke with Dr. Earlene Plater who is reaching out to the Pharmacy to ask about how patient can be contacted for infusion.

## 2019-09-20 ENCOUNTER — Ambulatory Visit (HOSPITAL_COMMUNITY)
Admission: RE | Admit: 2019-09-20 | Discharge: 2019-09-20 | Disposition: A | Discharge status: Home / Self Care | Payer: Medicaid Other | Source: Ambulatory Visit | Attending: Pulmonary Disease | Admitting: Pulmonary Disease

## 2019-09-20 DIAGNOSIS — U071 COVID-19: Secondary | ICD-10-CM | POA: Insufficient documentation

## 2019-09-20 MED ORDER — ALBUTEROL SULFATE HFA 108 (90 BASE) MCG/ACT IN AERS: 2.0000 | INHALATION_SPRAY | Freq: Once | RESPIRATORY_TRACT | Status: DC | PRN

## 2019-09-20 MED ORDER — DIPHENHYDRAMINE HCL 50 MG/ML IJ SOLN: 50.0000 mg | Freq: Once | INTRAMUSCULAR | Status: DC | PRN

## 2019-09-20 MED ORDER — SODIUM CHLORIDE 0.9 % IV SOLN
1200.0000 mg | Freq: Once | INTRAVENOUS | Status: AC
  Administered 2019-09-20: 1200 mg via INTRAVENOUS

## 2019-09-20 MED ORDER — FAMOTIDINE IN NACL 20-0.9 MG/50ML-% IV SOLN: 20.0000 mg | Freq: Once | INTRAVENOUS | Status: DC | PRN

## 2019-09-20 MED ORDER — EPINEPHRINE 0.3 MG/0.3ML IJ SOAJ: 0.3000 mg | Freq: Once | INTRAMUSCULAR | Status: DC | PRN

## 2019-09-20 MED ORDER — METHYLPREDNISOLONE SODIUM SUCC 125 MG IJ SOLR: 125.0000 mg | Freq: Once | INTRAMUSCULAR | Status: DC | PRN

## 2019-09-20 MED ORDER — SODIUM CHLORIDE 0.9 % IV SOLN: INTRAVENOUS | Status: DC | PRN

## 2019-09-20 NOTE — Progress Notes (Signed)
  Diagnosis: COVID-19  Physician: Dr. Wright  Procedure: Covid Infusion Clinic Med: casirivimab\imdevimab infusion - Provided patient with casirivimab\imdevimab fact sheet for patients, parents and caregivers prior to infusion.  Complications: No immediate complications noted.  Discharge: Discharged home   Ramy Greth 09/20/2019   

## 2019-09-20 NOTE — Discharge Instructions (Signed)

## 2019-09-25 ENCOUNTER — Encounter: Payer: Self-pay | Admitting: Advanced Practice Midwife

## 2019-10-01 ENCOUNTER — Ambulatory Visit (INDEPENDENT_AMBULATORY_CARE_PROVIDER_SITE_OTHER): Payer: Medicaid Other | Admitting: Advanced Practice Midwife

## 2019-10-01 ENCOUNTER — Encounter: Payer: Self-pay | Admitting: Advanced Practice Midwife

## 2019-10-01 ENCOUNTER — Other Ambulatory Visit: Payer: Self-pay

## 2019-10-01 VITALS — BP 128/72 | HR 91 | Wt 215.6 lb

## 2019-10-01 DIAGNOSIS — Z3480 Encounter for supervision of other normal pregnancy, unspecified trimester: Secondary | ICD-10-CM

## 2019-10-01 DIAGNOSIS — F329 Major depressive disorder, single episode, unspecified: Secondary | ICD-10-CM

## 2019-10-01 DIAGNOSIS — F32A Depression, unspecified: Secondary | ICD-10-CM

## 2019-10-01 DIAGNOSIS — Z3A24 24 weeks gestation of pregnancy: Secondary | ICD-10-CM

## 2019-10-01 NOTE — Progress Notes (Signed)
   PRENATAL VISIT NOTE  Subjective:  Kadeshia Kasparian is a 29 y.o. G2P1001 at [redacted]w[redacted]d being seen today for ongoing prenatal care.  She is currently monitored for the following issues for this low-risk pregnancy and has TOBACCO USER; DEPRESSION; BACK PAIN, LUMBAR, CHRONIC; Supervision of low-risk pregnancy; Obesity in pregnancy; Anxiety; History of C-section; Asymptomatic bacteriuria; and Alpha thalassemia silent carrier on their problem list.  Patient reports no complaints.  Contractions: Not present. Vag. Bleeding: None.  Movement: Present. Denies leaking of fluid.   The following portions of the patient's history were reviewed and updated as appropriate: allergies, current medications, past family history, past medical history, past social history, past surgical history and problem list.   Objective:   Vitals:   10/01/19 1606  BP: 128/72  Pulse: 91  Weight: 215 lb 9.6 oz (97.8 kg)    Fetal Status: Fetal Heart Rate (bpm): 159 Fundal Height: 25 cm Movement: Present     General:  Alert, oriented and cooperative. Patient is in no acute distress.  Skin: Skin is warm and dry. No rash noted.   Cardiovascular: Normal heart rate noted  Respiratory: Normal respiratory effort, no problems with respiration noted  Abdomen: Soft, gravid, appropriate for gestational age.  Pain/Pressure: Present     Pelvic: Cervical exam deferred        Extremities: Normal range of motion.  Edema: None  Mental Status: Normal mood and affect. Normal behavior. Normal judgment and thought content.   Assessment and Plan:  Pregnancy: G2P1001 at [redacted]w[redacted]d 1. Depression, unspecified depression type - Ambulatory referral to Integrated Behavioral Health  2. [redacted] weeks gestation of pregnancy - GTT and 28 week labs at next visit   3. Supervision of other normal pregnancy, antepartum - Patient had Covid on 9/15. She got mAB infusion and reports that this helped tremendously.  - Patient would like to decrease hours to part time at  work. She currently works 6 hours 5 days per week. She would like to work 3 hours per day instead. She feels as though she was exposed and got covid from work as she works in [employer]. Advised patient to discuss with employer and try to work out a decrease in hours. If employer does not agree patient can have a note to work part time.   Preterm labor symptoms and general obstetric precautions including but not limited to vaginal bleeding, contractions, leaking of fluid and fetal movement were reviewed in detail with the patient. Please refer to After Visit Summary for other counseling recommendations.   Return in about 4 weeks (around 10/29/2019) for in person visit with 28 week labs and GTT. Plus needs to see MD only at that visit to discuss VBAC .  No future appointments.  Thressa Sheller DNP, CNM  10/01/19  4:47 PM

## 2019-10-10 NOTE — BH Specialist Note (Signed)
Pt did not arrive to video visit and did not answer the phone ; Left HIPPA-compliant message to call back Hartman Minahan from [facility] at [phone] (main office) or [phone] (Donavon Kimrey's office).  ; left MyChart message for patient.      

## 2019-10-14 ENCOUNTER — Ambulatory Visit: Payer: Medicaid Other | Admitting: Clinical

## 2019-10-14 ENCOUNTER — Other Ambulatory Visit: Payer: Self-pay

## 2019-10-14 DIAGNOSIS — Z91199 Patient's noncompliance with other medical treatment and regimen due to unspecified reason: Secondary | ICD-10-CM

## 2019-10-14 DIAGNOSIS — Z5329 Procedure and treatment not carried out because of patient's decision for other reasons: Secondary | ICD-10-CM

## 2019-10-20 NOTE — BH Specialist Note (Deleted)
Integrated Behavioral Health Follow Up Visit  MRN: [id number] Name: Patty Smith  Number of Integrated Behavioral Health Clinician visits: 4/6 Session Start time: 10:15***  Session End time: 10:45*** Total time: {IBH Total Time:21014050}  Type of Service: Integrated Behavioral Health- Individual/Family Interpretor:No. Interpretor Name and Language: n/a  SUBJECTIVE: Patty Smith is a 29 y.o. female accompanied by {Patient accompanied by:[phone]} Patient was referred by Scheryl Darter, MD for ***. Patient reports the following symptoms/concerns: *** Duration of problem: ***; Severity of problem: {Mild/Moderate/Severe:20260}  OBJECTIVE: Mood: {BHH MOOD:22306} and Affect: {BHH AFFECT:22307} Risk of harm to self or others: {CHL AMB BH Suicide Current Mental Status:21022748}  LIFE CONTEXT: Family and Social: *** School/Work: *** Self-Care: *** Life Changes: Current pregnancy***  GOALS ADDRESSED: Patient will: 1.  Reduce symptoms of: {IBH Symptoms:21014056}  2.  Increase knowledge and/or ability of: {IBH Patient Tools:21014057}  3.  Demonstrate ability to: {IBH Goals:21014053}  INTERVENTIONS: Interventions utilized:  {IBH Interventions:21014054} Standardized Assessments completed: {IBH Screening Tools:21014051}  ASSESSMENT: Patient currently experiencing ***.   Patient may benefit from ***.  PLAN: 1. Follow up with behavioral health clinician on : *** 2. Behavioral recommendations: *** 3. Referral(s): {IBH Referrals:21014055} 4. "From scale of 1-10, how likely are you to follow plan?": ***  Valetta Close Didi Ganaway, LCSW

## 2019-10-28 ENCOUNTER — Other Ambulatory Visit: Payer: Self-pay

## 2019-10-28 DIAGNOSIS — Z3493 Encounter for supervision of normal pregnancy, unspecified, third trimester: Secondary | ICD-10-CM

## 2019-10-29 ENCOUNTER — Other Ambulatory Visit: Payer: Self-pay

## 2019-10-29 ENCOUNTER — Telehealth: Payer: Self-pay | Admitting: Clinical

## 2019-10-29 ENCOUNTER — Other Ambulatory Visit: Payer: Medicaid Other

## 2019-10-29 ENCOUNTER — Ambulatory Visit (INDEPENDENT_AMBULATORY_CARE_PROVIDER_SITE_OTHER): Payer: Medicaid Other | Admitting: Obstetrics and Gynecology

## 2019-10-29 ENCOUNTER — Encounter: Payer: Self-pay | Admitting: Obstetrics and Gynecology

## 2019-10-29 VITALS — BP 121/81 | HR 100 | Wt 220.0 lb

## 2019-10-29 DIAGNOSIS — Z3493 Encounter for supervision of normal pregnancy, unspecified, third trimester: Secondary | ICD-10-CM | POA: Diagnosis not present

## 2019-10-29 DIAGNOSIS — O26849 Uterine size-date discrepancy, unspecified trimester: Secondary | ICD-10-CM

## 2019-10-29 DIAGNOSIS — O99210 Obesity complicating pregnancy, unspecified trimester: Secondary | ICD-10-CM

## 2019-10-29 DIAGNOSIS — Z98891 History of uterine scar from previous surgery: Secondary | ICD-10-CM

## 2019-10-29 DIAGNOSIS — Z23 Encounter for immunization: Secondary | ICD-10-CM | POA: Diagnosis not present

## 2019-10-29 MED ORDER — ASPIRIN EC 81 MG PO TBEC: 81.0000 mg | DELAYED_RELEASE_TABLET | Freq: Every day | ORAL | 2 refills | Status: DC

## 2019-10-29 NOTE — Progress Notes (Signed)
   PRENATAL VISIT NOTE  Subjective:  Patty Smith is a 29 y.o. G2P1001 at [redacted]w[redacted]d being seen today for ongoing prenatal care.  She is currently monitored for the following issues for this high-risk pregnancy and has TOBACCO USER; DEPRESSION; BACK PAIN, LUMBAR, CHRONIC; Supervision of low-risk pregnancy; Obesity in pregnancy; Anxiety; History of C-section; Asymptomatic bacteriuria; and Alpha thalassemia silent carrier on their problem list.  Patient reports no complaints.  Contractions: Not present. Vag. Bleeding: None.  Movement: Present. Denies leaking of fluid.   The following portions of the patient's history were reviewed and updated as appropriate: allergies, current medications, past family history, past medical history, past social history, past surgical history and problem list.   Objective:   Vitals:   10/29/19 0853  BP: 121/81  Pulse: 100  Weight: 220 lb (99.8 kg)    Fetal Status: Fetal Heart Rate (bpm): 154 Fundal Height: 24 cm Movement: Present     General:  Alert, oriented and cooperative. Patient is in no acute distress.  Skin: Skin is warm and dry. No rash noted.   Cardiovascular: Normal heart rate noted  Respiratory: Normal respiratory effort, no problems with respiration noted  Abdomen: Soft, gravid, appropriate for gestational age.  Pain/Pressure: Present     Pelvic: Cervical exam deferred        Extremities: Normal range of motion.  Edema: None  Mental Status: Normal mood and affect. Normal behavior. Normal judgment and thought content.   Assessment and Plan:  Pregnancy: G2P1001 at [redacted]w[redacted]d  1. Encounter for supervision of low-risk pregnancy in third trimester 2 hr GTT HIV RPR CBC  2. History of C-section Reviewed risks/benefits of TOLAC versus RCS in detail. Patient counseled regarding potential vaginal delivery, chance of success, future implications, possible uterine rupture and need for urgent/emergent repeat cesarean. Counseled regarding potential need for  repeat c-section for reasons unrelated to first c-section. Counseled regarding scheduled repeat cesarean including risks of bleeding, infection, damage to surrounding tissue, abnormal placentation, implications for future pregnancies. All questions answered.  Patient desires TOLAC, consent signed 10/29/2019.  3. Obesity in pregnancy Not on baby aspirin  4. Fetal size inconsistent with dates Measuring smaller than dates F/u US ordered  Preterm labor symptoms and general obstetric precautions including but not limited to vaginal bleeding, contractions, leaking of fluid and fetal movement were reviewed in detail with the patient. Please refer to After Visit Summary for other counseling recommendations.   Return in about 2 weeks (around 11/12/2019) for low OB, in person.  Future Appointments  Date Time Provider Department Center  11/10/2019  1:00 PM [facility] NURSE [facility]  11/10/2019  1:15 PM [facility] US2 [facility]  11/10/2019  2:15 PM Raelyn Mora, CNM [facility]  11/10/2019  2:45 PM [facility] CLINICIAN [facility]    Conan Bowens, MD

## 2019-10-29 NOTE — Telephone Encounter (Cosign Needed)
Called pt to reschedule appt today 10/29/2019 due to [facility] being out of the office. Pt identity was confirmed using two identifiers. Pt stated she was coming into the office today for another appt and would reschedule with the front desk. -K. Tereasa Coop

## 2019-10-30 ENCOUNTER — Encounter: Payer: Self-pay | Admitting: Obstetrics and Gynecology

## 2019-10-30 ENCOUNTER — Telehealth: Payer: Self-pay

## 2019-10-30 DIAGNOSIS — O24419 Gestational diabetes mellitus in pregnancy, unspecified control: Secondary | ICD-10-CM | POA: Insufficient documentation

## 2019-10-30 LAB — HIV ANTIBODY (ROUTINE TESTING W REFLEX): HIV Screen 4th Generation wRfx: NONREACTIVE

## 2019-10-30 LAB — CBC
Hematocrit: 35.8 % (ref 34.0–46.6)
Hemoglobin: 12 g/dL (ref 11.1–15.9)
MCH: 28.9 pg (ref 26.6–33.0)
MCHC: 33.5 g/dL (ref 31.5–35.7)
MCV: 86 fL (ref 79–97)
Platelets: 247 10*3/uL (ref 150–450)
RBC: 4.15 x10E6/uL (ref 3.77–5.28)
RDW: 12.3 % (ref 11.7–15.4)
WBC: 10.5 10*3/uL (ref 3.4–10.8)

## 2019-10-30 LAB — GLUCOSE TOLERANCE, 2 HOURS W/ 1HR
Glucose, 1 hour: 185 mg/dL — ABNORMAL HIGH (ref 65–179)
Glucose, 2 hour: 113 mg/dL (ref 65–152)
Glucose, Fasting: 81 mg/dL (ref 65–91)

## 2019-10-30 LAB — RPR: RPR Ser Ql: NONREACTIVE

## 2019-10-30 NOTE — Telephone Encounter (Addendum)
-----   Message from Conan Bowens, MD sent at 10/30/2019  3:42 PM EDT ----- Needs diabetic education/counseling  Called pt and left message that I am calling with results and an appointment on 11/06/19 if she can give the office a call back.    Addison Naegeli, RN  10/30/19

## 2019-10-30 NOTE — BH Specialist Note (Signed)
Pt did not arrive to video visit and did not answer the phone ; Left HIPPA-compliant message to call back Ronit Marczak from [facility] at [facility] at [phone] (main office) or [phone] (Edin Skarda's office).  ; left MyChart message for patient.      

## 2019-10-31 NOTE — Telephone Encounter (Signed)
Called and left message that I am calling regards to her results and an appt scheduled on 11/06/19 @ 1115.  If she could please give the office a call back or respond to her MyChart message.  MyChart message sent.    Addison Naegeli, RN  10/31/19

## 2019-11-06 ENCOUNTER — Other Ambulatory Visit: Payer: Self-pay | Admitting: *Deleted

## 2019-11-06 ENCOUNTER — Ambulatory Visit: Payer: Medicaid Other | Admitting: Registered"

## 2019-11-06 ENCOUNTER — Ambulatory Visit: Payer: Medicaid Other

## 2019-11-06 ENCOUNTER — Encounter: Payer: Medicaid Other | Attending: Obstetrics and Gynecology | Admitting: Registered"

## 2019-11-06 ENCOUNTER — Other Ambulatory Visit: Payer: Self-pay

## 2019-11-06 DIAGNOSIS — O24419 Gestational diabetes mellitus in pregnancy, unspecified control: Secondary | ICD-10-CM

## 2019-11-06 DIAGNOSIS — Z3A00 Weeks of gestation of pregnancy not specified: Secondary | ICD-10-CM | POA: Diagnosis not present

## 2019-11-06 MED ORDER — ACCU-CHEK SOFTCLIX LANCETS MISC: 12 refills | Status: DC

## 2019-11-06 MED ORDER — ACCU-CHEK GUIDE VI STRP: ORAL_STRIP | 12 refills | Status: DC

## 2019-11-06 NOTE — Progress Notes (Signed)
Patient was seen on 11/06/19 for Gestational Diabetes self-management. EDD 01/15/20, 30w. Patient states no history of GDM. Diet history obtained. Patient eats variety of all food groups. Beverages include water, 1/2 c coffee in the morning.   The following learning objectives were met by the patient :   States the definition of Gestational Diabetes  States why dietary management is important in controlling blood glucose  Describes the effects of carbohydrates on blood glucose levels  Demonstrates ability to create a balanced meal plan  Demonstrates carbohydrate counting   States when to check blood glucose levels  Demonstrates proper blood glucose monitoring techniques  States the effect of stress and exercise on blood glucose levels  States the importance of limiting caffeine and abstaining from alcohol and smoking  Plan:  Aim for 3 Carbohydrate Choices per meal (45 grams) +/- 1 either way  Aim for 1-2 Carbohydrate Choices per snack Begin reading food labels for Total Carbohydrate of foods If OK with your MD, consider  increasing your activity level by walking, Arm Chair Exercises or other activity daily as tolerated Begin checking Blood Glucose before breakfast and 2 hours after first bite of breakfast, lunch and dinner as directed by MD  Bring Log Book/Sheet and meter to every medical appointment  Baby Scripts: (BS 2.0 not capable of glucose management at this time.) Patient to record blood sugar on glucose log sheet  Take medication if directed by MD  Blood glucose monitor given: Accu-chek Guide Me Lot #[id number] Exp: 09/23/2020 CBG: 76 mg/dL  Rx order placed for  Accu-chek Guide strips and Softclix lancets  Patient instructed to monitor glucose levels: FBS: 60 - 95 mg/dl 2 hour: <120 mg/dl  Patient received the following handouts:  Nutrition Diabetes and Pregnancy  Carbohydrate Counting List  Blood glucose Log Sheet  Patient will be seen for follow-up as needed.

## 2019-11-10 ENCOUNTER — Encounter: Payer: Medicaid Other | Admitting: Obstetrics and Gynecology

## 2019-11-10 ENCOUNTER — Ambulatory Visit: Payer: Medicaid Other | Attending: Obstetrics and Gynecology

## 2019-11-10 ENCOUNTER — Ambulatory Visit: Payer: Medicaid Other

## 2019-11-10 ENCOUNTER — Ambulatory Visit: Payer: Medicaid Other | Admitting: Clinical

## 2019-11-12 ENCOUNTER — Ambulatory Visit (INDEPENDENT_AMBULATORY_CARE_PROVIDER_SITE_OTHER): Payer: Medicaid Other | Admitting: Obstetrics and Gynecology

## 2019-11-12 ENCOUNTER — Other Ambulatory Visit: Payer: Self-pay

## 2019-11-12 VITALS — BP 110/76 | HR 98 | Wt 222.0 lb

## 2019-11-12 DIAGNOSIS — F17200 Nicotine dependence, unspecified, uncomplicated: Secondary | ICD-10-CM

## 2019-11-12 DIAGNOSIS — O24410 Gestational diabetes mellitus in pregnancy, diet controlled: Secondary | ICD-10-CM

## 2019-11-12 DIAGNOSIS — D563 Thalassemia minor: Secondary | ICD-10-CM

## 2019-11-12 DIAGNOSIS — Z3A30 30 weeks gestation of pregnancy: Secondary | ICD-10-CM

## 2019-11-12 DIAGNOSIS — Z98891 History of uterine scar from previous surgery: Secondary | ICD-10-CM

## 2019-11-12 DIAGNOSIS — N871 Moderate cervical dysplasia: Secondary | ICD-10-CM

## 2019-11-12 NOTE — Progress Notes (Signed)
   PRENATAL VISIT NOTE  Subjective:  Patty Smith is a 29 y.o. G2P1001 at [redacted]w[redacted]d being seen today for ongoing prenatal care.  She is currently monitored for the following issues for this high-risk pregnancy and has TOBACCO USER; DEPRESSION; BACK PAIN, LUMBAR, CHRONIC; Supervision of low-risk pregnancy; Obesity in pregnancy; Anxiety; History of C-section; Asymptomatic bacteriuria; Alpha thalassemia silent carrier; Gestational diabetes; and [redacted] weeks gestation of pregnancy on their problem list.  Patient doing well with no acute concerns today. She reports no complaints.  Contractions: Not present. Vag. Bleeding: None.  Movement: Present. Denies leaking of fluid.   Pt has had insurance issues getting her diabetes supplies so she has not checked her blood sugars since Saturday.  Pt notes renewed compliance with her insurance being modified to acquire the supplies.    The following portions of the patient's history were reviewed and updated as appropriate: allergies, current medications, past family history, past medical history, past social history, past surgical history and problem list. Problem list updated.  Objective:   Vitals:   11/12/19 1022  BP: 110/76  Pulse: 98  Weight: 222 lb (100.7 kg)    Fetal Status: Fetal Heart Rate (bpm): 152 Fundal Height: 29 cm Movement: Present     General:  Alert, oriented and cooperative. Patient is in no acute distress.  Skin: Skin is warm and dry. No rash noted.   Cardiovascular: Normal heart rate noted  Respiratory: Normal respiratory effort, no problems with respiration noted  Abdomen: Soft, gravid, appropriate for gestational age.  Pain/Pressure: Present     Pelvic: Cervical exam deferred        Extremities: Normal range of motion.  Edema: None  Mental Status:  Normal mood and affect. Normal behavior. Normal judgment and thought content.   Assessment and Plan:  Pregnancy: G2P1001 at [redacted]w[redacted]d   2. Diet controlled gestational diabetes mellitus (GDM) in  third trimester Pt to collect blood sugars, growth scan on 11/13/19  3. History of C-section Chart reviewed TOLAC counseling with Dr. Earlene Plater, pt prefers TOLAC  4. Alpha thalassemia silent carrier   5. TOBACCO USER   6. [redacted] weeks gestation of pregnancy   Preterm labor symptoms and general obstetric precautions including but not limited to vaginal bleeding, contractions, leaking of fluid and fetal movement were reviewed in detail with the patient.  Please refer to After Visit Summary for other counseling recommendations.   Return in about 2 weeks (around 11/26/2019).   Mariel Aloe, MD

## 2019-11-12 NOTE — Patient Instructions (Signed)
Gestational Diabetes Mellitus, Self Care When you have gestational diabetes (gestational diabetes mellitus), you must make sure your blood sugar (glucose) stays in a healthy range. You can do this with:  Nutrition.  Exercise.  Lifestyle changes.  Medicines or insulin, if needed.  Support from your doctors and others. If you get treated for this condition, it may not hurt you or your unborn baby (fetus). If you do not get treated for this condition, it may cause problems that can hurt you or your unborn baby. If you get gestational diabetes, you are:  More likely to get it if you get pregnant again.  More likely to develop type 2 diabetes in the future. How to stay aware of blood sugar   Check your blood sugar every day while you are pregnant. Check it as often as told.  Call your doctor if your blood sugar is above your goal numbers for two tests in a row. Your doctor will set personal treatment goals for you. Generally, you should have these blood sugar levels:  Before meals, or after not eating for a long time (fasting or preprandial): at or below 95 mg/dL (5.3 mmol/L).  After meals (postprandial): ? One hour after a meal: at or below 140 mg/dL (7.8 mmol/L). ? Two hours after a meal: at or below 120 mg/dL (6.7 mmol/L).  A1c (hemoglobin A1c) level: 6-6.5%. How to manage high and low blood sugar Signs of high blood sugar High blood sugar is called hyperglycemia. Know the early signs of high blood sugar. Signs may include:  Feeling: ? Thirsty. ? Hungry. ? Very tired.  Needing to pee (urinate) more than usual.  Blurry vision. Signs of low blood sugar Low blood sugar is called hypoglycemia. This is when blood sugar is at or below 70 mg/dL (3.9 mmol/L). Signs may include:  Feeling: ? Hungry. ? Worried or nervous (anxious). ? Sweaty and clammy. ? Confused. ? Dizzy. ? Sleepy. ? Sick to your stomach (nauseous).  Having: ? A fast heartbeat. ? A headache. ? A change  in your vision. ? Tingling or no feeling (numbness) around your mouth, lips, or tongue. ? Jerky movements that you cannot control (seizure).  Having trouble with: ? Moving (coordination). ? Sleeping. ? Passing out (fainting). ? Getting upset easily (irritability). Treating low blood sugar To treat low blood sugar, eat or drink something sugary right away. If you can think clearly and swallow safely, follow the 15:15 rule:  Take 15 grams of a fast-acting carb (carbohydrate). Talk with your doctor about how much you should take.  Some fast-acting carbs are: ? Sugar tablets (glucose pills). Take 3-4 glucose pills. ? 6-8 pieces of hard candy. ? 4-6 oz (120-150 mL) of fruit juice. ? 4-6 oz (120-150 mL) of regular (not diet) soda. ? 1 Tbsp (15 mL) honey or sugar.  Check your blood sugar 15 minutes after you take the carb.  If your blood sugar is still at or below 70 mg/dL (3.9 mmol/L), take 15 grams of a carb again.  If your blood sugar does not go above 70 mg/dL (3.9 mmol/L) after 3 tries, get help right away.  After your blood sugar goes back to normal, eat a meal or a snack within 1 hour. Treating very low blood sugar If your blood sugar is at or below 54 mg/dL (3 mmol/L), you have very low blood sugar (severe hypoglycemia). This is an emergency. Do not wait to see if the symptoms will go away. Get medical help right  away. Call your local emergency services (911 in the [place] If you have very low blood sugar and you cannot eat or drink, you may need a glucagon shot (injection). A family member or friend should learn how to check your blood sugar and how to give you a glucagon shot. Ask your doctor if you need to have a glucagon shot kit at home. Follow these instructions at home: Medicine  Take your insulin and diabetes medicines as told.  If your doctor says you should take more or less insulin or medicines, do this exactly as told.  Do not run out of insulin or  medicines. Food   Make healthy food choices. These include: ? Chicken, fish, egg whites, and beans. ? Oats, whole wheat, bulgur, brown rice, quinoa, and millet. ? Fresh fruits and vegetables. ? Low-fat dairy products. ? Nuts, avocado, olive oil, and canola oil.  Meet with a food specialist (dietitian). He or she can help you make an eating plan that is right for you.  Follow instructions from your doctor about what you cannot eat or drink.  Drink enough fluid to keep your pee (urine) pale yellow.  Eat healthy snacks between healthy meals.  Keep track of carbs that you eat. Do this by reading food labels and learning food serving sizes.  Follow your sick day plan when you cannot eat or drink normally. Make this plan with your doctor so it is ready to use. Activity  Exercise for 30 or more minutes a day, or as much as your doctor recommends.  Talk with your doctor before you start a new exercise or activity. Your doctor may need to tell you to change: ? How much insulin or medicines you take. ? How much food you eat. Lifestyle  Do not drink alcohol.  Do not use any tobacco products. These include cigarettes, chewing tobacco, and e-cigarettes. If you need help quitting, ask your doctor.  Learn how to deal with stress. If you need help with this, ask your doctor. Body care  Stay up to date with your shots (immunizations).  Brush your teeth and gums two times a day. Floss one or more times a day.  Go to the dentist one or more times every 6 months.  Stay at a healthy weight while you are pregnant. General instructions  Take over-the-counter and prescription medicines only as told by your doctor.  Ask your doctor about risks of high blood pressure in pregnancy (preeclampsia and eclampsia).  Share your diabetes care plan with: ? Your work or school. ? People you live with.  Check your pee for ketones: ? When you are sick. ? As told by your doctor.  Carry a card or  wear jewelry that says you have diabetes.  Keep all follow-up visits as told by your doctor. This is important. Care after giving birth  Have your blood sugar checked 4-12 weeks after you give birth.  Get checked for diabetes one or more times during 3 years. Questions to ask your doctor  Do I need to meet with a diabetes educator?  Where can I find a support group for people with gestational diabetes? Where to find more information To learn more about diabetes, visit:  American Diabetes Association: www.diabetes.org  Centers for Disease Control and Prevention (CDC): www.cdc.gov Summary  Check your blood sugar (glucose) every day while you are pregnant. Check it as often as told.  Take your insulin and diabetes medicines as told.  Keep all follow-up visits as   told by your doctor. This is important.  Have your blood sugar checked 4-12 weeks after you give birth. This information is not intended to replace advice given to you by your health care provider. Make sure you discuss any questions you have with your health care provider. Document Revised: 06/11/2017 Document Reviewed: 01/22/2015 Elsevier Patient Education  [place] of Pregnancy  The third trimester is from week 28 through week 40 (months 7 through 9). This trimester is when your unborn baby (fetus) is growing very fast. At the end of the ninth month, the unborn baby is about 20 inches in length. It weighs about 6-10 pounds. Follow these instructions at home: Medicines  Take over-the-counter and prescription medicines only as told by your doctor. Some medicines are safe and some medicines are not safe during pregnancy.  Take a prenatal vitamin that contains at least 600 micrograms (mcg) of folic acid.  If you have trouble pooping (constipation), take medicine that will make your stool soft (stool softener) if your doctor approves. Eating and drinking   Eat regular, healthy meals.  Avoid  raw meat and uncooked cheese.  If you get low calcium from the food you eat, talk to your doctor about taking a daily calcium supplement.  Eat four or five small meals rather than three large meals a day.  Avoid foods that are high in fat and sugars, such as fried and sweet foods.  To prevent constipation: ? Eat foods that are high in fiber, like fresh fruits and vegetables, whole grains, and beans. ? Drink enough fluids to keep your pee (urine) clear or pale yellow. Activity  Exercise only as told by your doctor. Stop exercising if you start to have cramps.  Avoid heavy lifting, wear low heels, and sit up straight.  Do not exercise if it is too hot, too humid, or if you are in a place of great height (high altitude).  You may continue to have sex unless your doctor tells you not to. Relieving pain and discomfort  Wear a good support bra if your breasts are tender.  Take frequent breaks and rest with your legs raised if you have leg cramps or low back pain.  Take warm water baths (sitz baths) to soothe pain or discomfort caused by hemorrhoids. Use hemorrhoid cream if your doctor approves.  If you develop puffy, bulging veins (varicose veins) in your legs: ? Wear support hose or compression stockings as told by your doctor. ? Raise (elevate) your feet for 15 minutes, 3-4 times a day. ? Limit salt in your food. Safety  Wear your seat belt when driving.  Make a list of emergency phone numbers, including numbers for family, friends, the hospital, and police and fire departments. Preparing for your baby's arrival To prepare for the arrival of your baby:  Take prenatal classes.  Practice driving to the hospital.  Visit the hospital and tour the maternity area.  Talk to your work about taking leave once the baby comes.  Pack your hospital bag.  Prepare the baby's room.  Go to your doctor visits.  Buy a rear-facing car seat. Learn how to install it in your car. General  instructions  Do not use hot tubs, steam rooms, or saunas.  Do not use any products that contain nicotine or tobacco, such as cigarettes and e-cigarettes. If you need help quitting, ask your doctor.  Do not drink alcohol.  Do not douche or use tampons or scented sanitary pads.  Do not cross your legs for long periods of time.  Do not travel for long distances unless you must. Only do so if your doctor says it is okay.  Visit your dentist if you have not gone during your pregnancy. Use a soft toothbrush to brush your teeth. Be gentle when you floss.  Avoid cat litter boxes and soil used by cats. These carry germs that can cause birth defects in the baby and can cause a loss of your baby (miscarriage) or stillbirth.  Keep all your prenatal visits as told by your doctor. This is important. Contact a doctor if:  You are not sure if you are in labor or if your water has broken.  You are dizzy.  You have mild cramps or pressure in your lower belly.  You have a nagging pain in your belly area.  You continue to feel sick to your stomach, you throw up, or you have watery poop.  You have bad smelling fluid coming from your vagina.  You have pain when you pee. Get help right away if:  You have a fever.  You are leaking fluid from your vagina.  You are spotting or bleeding from your vagina.  You have severe belly cramps or pain.  You lose or gain weight quickly.  You have trouble catching your breath and have chest pain.  You notice sudden or extreme puffiness (swelling) of your face, hands, ankles, feet, or legs.  You have not felt the baby move in over an hour.  You have severe headaches that do not go away with medicine.  You have trouble seeing.  You are leaking, or you are having a gush of fluid, from your vagina before you are 37 weeks.  You have regular belly spasms (contractions) before you are 37 weeks. Summary  The third trimester is from week 28 through week 40 (months  7 through 9). This time is when your unborn baby is growing very fast.  Follow your doctor's advice about medicine, food, and activity.  Get ready for the arrival of your baby by taking prenatal classes, getting all the baby items ready, preparing the baby's room, and visiting your doctor to be checked.  Get help right away if you are bleeding from your vagina, or you have chest pain and trouble catching your breath, or if you have not felt your baby move in over an hour. This information is not intended to replace advice given to you by your health care provider. Make sure you discuss any questions you have with your health care provider. Document Revised: 04/11/2018 Document Reviewed: 01/25/2016 Elsevier Patient Education  2020 Elsevier Inc.  

## 2019-11-13 ENCOUNTER — Ambulatory Visit: Payer: Medicaid Other | Admitting: *Deleted

## 2019-11-13 ENCOUNTER — Encounter: Payer: Self-pay | Admitting: *Deleted

## 2019-11-13 ENCOUNTER — Ambulatory Visit: Payer: Medicaid Other | Attending: Obstetrics and Gynecology

## 2019-11-13 DIAGNOSIS — O34219 Maternal care for unspecified type scar from previous cesarean delivery: Secondary | ICD-10-CM

## 2019-11-13 DIAGNOSIS — E669 Obesity, unspecified: Secondary | ICD-10-CM

## 2019-11-13 DIAGNOSIS — O26849 Uterine size-date discrepancy, unspecified trimester: Secondary | ICD-10-CM | POA: Diagnosis present

## 2019-11-13 DIAGNOSIS — Z148 Genetic carrier of other disease: Secondary | ICD-10-CM

## 2019-11-13 DIAGNOSIS — O99213 Obesity complicating pregnancy, third trimester: Secondary | ICD-10-CM

## 2019-11-13 DIAGNOSIS — Z98891 History of uterine scar from previous surgery: Secondary | ICD-10-CM | POA: Diagnosis present

## 2019-11-13 DIAGNOSIS — O99210 Obesity complicating pregnancy, unspecified trimester: Secondary | ICD-10-CM | POA: Diagnosis present

## 2019-11-13 DIAGNOSIS — F419 Anxiety disorder, unspecified: Secondary | ICD-10-CM

## 2019-11-13 DIAGNOSIS — O26843 Uterine size-date discrepancy, third trimester: Secondary | ICD-10-CM | POA: Diagnosis not present

## 2019-11-13 DIAGNOSIS — O24410 Gestational diabetes mellitus in pregnancy, diet controlled: Secondary | ICD-10-CM | POA: Diagnosis not present

## 2019-11-13 DIAGNOSIS — Z3A31 31 weeks gestation of pregnancy: Secondary | ICD-10-CM

## 2019-11-14 ENCOUNTER — Other Ambulatory Visit: Payer: Self-pay | Admitting: *Deleted

## 2019-11-14 DIAGNOSIS — R638 Other symptoms and signs concerning food and fluid intake: Secondary | ICD-10-CM

## 2019-11-17 ENCOUNTER — Other Ambulatory Visit: Payer: Self-pay | Admitting: *Deleted

## 2019-11-17 ENCOUNTER — Other Ambulatory Visit: Payer: Self-pay

## 2019-11-17 DIAGNOSIS — O24419 Gestational diabetes mellitus in pregnancy, unspecified control: Secondary | ICD-10-CM

## 2019-11-18 NOTE — BH Specialist Note (Deleted)
Integrated Behavioral Health Follow Up Visit  MRN: [id number] Name: Patty Smith  Number of Integrated Behavioral Health Clinician visits: 4/6 Session Start time: 3:15***  Session End time: 3:45*** Total time: {IBH Total Time:21014050}  Type of Service: Integrated Behavioral Health- Individual/Family Interpretor:No. Interpretor Name and Language: n/a  SUBJECTIVE: Patty Smith is a 29 y.o. female accompanied by n/a Patient was referred by Thressa Sheller, CNM for depression. Patient reports the following symptoms/concerns: *** Duration of problem: ***; Severity of problem: {Mild/Moderate/Severe:20260}  OBJECTIVE: Mood: {BHH MOOD:22306} and Affect: {BHH AFFECT:22307} Risk of harm to self or others: {CHL AMB BH Suicide Current Mental Status:21022748}  LIFE CONTEXT: Family and Social: *** School/Work: *** Self-Care: *** Life Changes: ***  GOALS ADDRESSED: Patient will: 1.  Reduce symptoms of: {IBH Symptoms:21014056}  2.  Increase knowledge and/or ability of: {IBH Patient Tools:21014057}  3.  Demonstrate ability to: {IBH Goals:21014053}  INTERVENTIONS: Interventions utilized:  {IBH Interventions:21014054} Standardized Assessments completed: {IBH Screening Tools:21014051}  ASSESSMENT: Patient currently experiencing ***.   Patient may benefit from ***.  PLAN: 1. Follow up with behavioral health clinician on : *** 2. Behavioral recommendations: *** 3. Referral(s): {IBH Referrals:21014055} 4. "From scale of 1-10, how likely are you to follow plan?": ***  Valetta Close Kinleigh Nault, LCSW

## 2019-11-20 ENCOUNTER — Encounter (HOSPITAL_COMMUNITY): Payer: Self-pay | Admitting: Obstetrics and Gynecology

## 2019-11-20 ENCOUNTER — Inpatient Hospital Stay (HOSPITAL_COMMUNITY)
Admission: AD | Admit: 2019-11-20 | Discharge: 2019-11-20 | Disposition: A | Discharge status: Home / Self Care | Payer: Medicaid Other | Attending: Obstetrics and Gynecology | Admitting: Obstetrics and Gynecology

## 2019-11-20 ENCOUNTER — Other Ambulatory Visit: Payer: Self-pay

## 2019-11-20 DIAGNOSIS — Z87891 Personal history of nicotine dependence: Secondary | ICD-10-CM | POA: Insufficient documentation

## 2019-11-20 DIAGNOSIS — B373 Candidiasis of vulva and vagina: Secondary | ICD-10-CM

## 2019-11-20 DIAGNOSIS — O26899 Other specified pregnancy related conditions, unspecified trimester: Secondary | ICD-10-CM

## 2019-11-20 DIAGNOSIS — R102 Pelvic and perineal pain: Secondary | ICD-10-CM | POA: Diagnosis not present

## 2019-11-20 DIAGNOSIS — Z7982 Long term (current) use of aspirin: Secondary | ICD-10-CM | POA: Diagnosis not present

## 2019-11-20 DIAGNOSIS — Z3689 Encounter for other specified antenatal screening: Secondary | ICD-10-CM

## 2019-11-20 DIAGNOSIS — Z79899 Other long term (current) drug therapy: Secondary | ICD-10-CM | POA: Diagnosis not present

## 2019-11-20 DIAGNOSIS — M549 Dorsalgia, unspecified: Secondary | ICD-10-CM | POA: Diagnosis present

## 2019-11-20 DIAGNOSIS — O98813 Other maternal infectious and parasitic diseases complicating pregnancy, third trimester: Secondary | ICD-10-CM | POA: Diagnosis not present

## 2019-11-20 DIAGNOSIS — G8929 Other chronic pain: Secondary | ICD-10-CM | POA: Diagnosis not present

## 2019-11-20 DIAGNOSIS — O26893 Other specified pregnancy related conditions, third trimester: Secondary | ICD-10-CM | POA: Insufficient documentation

## 2019-11-20 DIAGNOSIS — R109 Unspecified abdominal pain: Secondary | ICD-10-CM

## 2019-11-20 DIAGNOSIS — Z3A32 32 weeks gestation of pregnancy: Secondary | ICD-10-CM | POA: Insufficient documentation

## 2019-11-20 DIAGNOSIS — M545 Low back pain, unspecified: Secondary | ICD-10-CM

## 2019-11-20 LAB — URINALYSIS, ROUTINE W REFLEX MICROSCOPIC
Bilirubin Urine: NEGATIVE
Glucose, UA: NEGATIVE mg/dL
Hgb urine dipstick: NEGATIVE
Ketones, ur: NEGATIVE mg/dL
Leukocytes,Ua: NEGATIVE
Nitrite: NEGATIVE
Protein, ur: NEGATIVE mg/dL
Specific Gravity, Urine: 1.023 (ref 1.005–1.030)
pH: 6 (ref 5.0–8.0)

## 2019-11-20 LAB — WET PREP, GENITAL
Clue Cells Wet Prep HPF POC: NONE SEEN
Sperm: NONE SEEN
Trich, Wet Prep: NONE SEEN

## 2019-11-20 MED ORDER — CYCLOBENZAPRINE HCL 5 MG PO TABS
10.0000 mg | ORAL_TABLET | Freq: Three times a day (TID) | ORAL | Status: DC | PRN
  Administered 2019-11-20: 10 mg via ORAL
  Filled 2019-11-20 (×2): qty 2

## 2019-11-20 MED ORDER — CYCLOBENZAPRINE HCL 10 MG PO TABS: 10.0000 mg | ORAL_TABLET | Freq: Three times a day (TID) | ORAL | 0 refills | Status: DC | PRN

## 2019-11-20 NOTE — Discharge Instructions (Signed)
Back Pain in Pregnancy Back pain during pregnancy is common. Back pain may be caused by several factors that are related to changes during your pregnancy. Follow these instructions at home: Managing pain, stiffness, and swelling      If directed, for sudden (acute) back pain, put ice on the painful area. ? Put ice in a plastic bag. ? Place a towel between your skin and the bag. ? Leave the ice on for 20 minutes, 2-3 times per day.  If directed, apply heat to the affected area before you exercise. Use the heat source that your health care provider recommends, such as a moist heat pack or a heating pad. ? Place a towel between your skin and the heat source. ? Leave the heat on for 20-30 minutes. ? Remove the heat if your skin turns bright red. This is especially important if you are unable to feel pain, heat, or cold. You may have a greater risk of getting burned.  If directed, massage the affected area. Activity  Exercise as told by your health care provider. Gentle exercise is the best way to prevent or manage back pain.  Listen to your body when lifting. If lifting hurts, ask for help or bend your knees. This uses your leg muscles instead of your back muscles.  Squat down when picking up something from the floor. Do not bend over.  Only use bed rest for short periods as told by your health care provider. Bed rest should only be used for the most severe episodes of back pain. Standing, sitting, and lying down  Do not stand in one place for long periods of time.  Use good posture when sitting. Make sure your head rests over your shoulders and is not hanging forward. Use a pillow on your lower back if necessary.  Try sleeping on your side, preferably the left side, with a pregnancy support pillow or 1-2 regular pillows between your legs. ? If you have back pain after a night's rest, your bed may be too soft. ? A firm mattress may provide more support for your back during  pregnancy. General instructions  Do not wear high heels.  Eat a healthy diet. Try to gain weight within your health care provider's recommendations.  Use a maternity girdle, elastic sling, or back brace as told by your health care provider.  Take over-the-counter and prescription medicines only as told by your health care provider.  Work with a physical therapist or massage therapist to find ways to manage back pain. Acupuncture or massage therapy may be helpful.  Keep all follow-up visits as told by your health care provider. This is important. Contact a health care provider if:  Your back pain interferes with your daily activities.  You have increasing pain in other parts of your body. Get help right away if:  You develop numbness, tingling, weakness, or problems with the use of your arms or legs.  You develop severe back pain that is not controlled with medicine.  You have a change in bowel or bladder control.  You develop shortness of breath, dizziness, or you faint.  You develop nausea, vomiting, or sweating.  You have back pain that is a rhythmic, cramping pain similar to labor pains. Labor pain is usually 1-2 minutes apart, lasts for about 1 minute, and involves a bearing down feeling or pressure in your pelvis.  You have back pain and your water breaks or you have vaginal bleeding.  You have back pain or numbness  that travels down your leg.  Your back pain developed after you fell.  You develop pain on one side of your back.  You see blood in your urine.  You develop skin blisters in the area of your back pain. Summary  Back pain may be caused by several factors that are related to changes during your pregnancy.  Follow instructions as told by your health care provider for managing pain, stiffness, and swelling.  Exercise as told by your health care provider. Gentle exercise is the best way to prevent or manage back pain.  Take over-the-counter and  prescription medicines only as told by your health care provider.  Keep all follow-up visits as told by your health care provider. This is important. This information is not intended to replace advice given to you by your health care provider. Make sure you discuss any questions you have with your health care provider. Document Revised: 04/09/2018 Document Reviewed: 06/06/2017 Elsevier Patient Education  [place].   Round Ligament Pain  The round ligament is a cord of muscle and tissue that helps support the uterus. It can become a source of pain during pregnancy if it becomes stretched or twisted as the baby grows. The pain usually begins in the second trimester (13-28 weeks) of pregnancy, and it can come and go until the baby is delivered. It is not a serious problem, and it does not cause harm to the baby. Round ligament pain is usually a short, sharp, and pinching pain, but it can also be a dull, lingering, and aching pain. The pain is felt in the lower side of the abdomen or in the groin. It usually starts deep in the groin and moves up to the outside of the hip area. The pain may occur when you:  Suddenly change position, such as quickly going from a sitting to standing position.  Roll over in bed.  Cough or sneeze.  Do physical activity. Follow these instructions at home:   Watch your condition for any changes.  When the pain starts, relax. Then try any of these methods to help with the pain: ? Sitting down. ? Flexing your knees up to your abdomen. ? Lying on your side with one pillow under your abdomen and another pillow between your legs. ? Sitting in a warm bath for 15-20 minutes or until the pain goes away.  Take over-the-counter and prescription medicines only as told by your health care provider.  Move slowly when you sit down or stand up.  Avoid long walks if they cause pain.  Stop or reduce your physical activities if they cause pain.  Keep all  follow-up visits as told by your health care provider. This is important. Contact a health care provider if:  Your pain does not go away with treatment.  You feel pain in your back that you did not have before.  Your medicine is not helping. Get help right away if:  You have a fever or chills.  You develop uterine contractions.  You have vaginal bleeding.  You have nausea or vomiting.  You have diarrhea.  You have pain when you urinate. Summary  Round ligament pain is felt in the lower abdomen or groin. It is usually a short, sharp, and pinching pain. It can also be a dull, lingering, and aching pain.  This pain usually begins in the second trimester (13-28 weeks). It occurs because the uterus is stretching with the growing baby, and it is not harmful to the  baby.  You may notice the pain when you suddenly change position, when you cough or sneeze, or during physical activity.  Relaxing, flexing your knees to your abdomen, lying on one side, or taking a warm bath may help to get rid of the pain.  Get help from your health care provider if the pain does not go away or if you have vaginal bleeding, nausea, vomiting, diarrhea, or painful urination. This information is not intended to replace advice given to you by your health care provider. Make sure you discuss any questions you have with your health care provider. Document Revised: 06/06/2017 Document Reviewed: 06/06/2017 Elsevier Patient Education  2020 [employer] Inc.  

## 2019-11-20 NOTE — MAU Provider Note (Signed)
History     CSN: [id number]  Arrival date and time: 11/20/19 0816   First Provider Initiated Contact with Patient 11/20/19 0906       29 y.o. G2P1001 @32 .0 wks presenting with LBP and low abdominal pressure. LBP started 2 days ago and worsened today. She denies recent fall or injury, heavy lifting, or strenuous activity. She does report hx of chronic LBP. The abdominal pressure has been ongoing for 2-3 weeks with some irreg BH ctx. She also feels pain that radiates into her RLQ that is worse with movement and standing. She denies VB, LOF, or vaginal discharge. No urinary sx. She took Tylenol for the pain which did not help. She did not try any other treatments. She uses a maternity belt daily while on her feet at work. Reports good FM.    OB History    Gravida  2   Para  1   Term  1   Preterm      AB      Living  1     SAB      TAB      Ectopic      Multiple      Live Births  1           Past Medical History:  Diagnosis Date  . Anxiety   . Complication of anesthesia    epidural /or spinal didin't work- stuck many times  . Depression    doing ok  . Endometriosis    not officially diagnosed by surgery or [place] but prior MD treated with ocp's   . Infection    UTI  . Scarlet fever 2001  . Viral meningitis     Past Surgical History:  Procedure Laterality Date  . CESAREAN SECTION      Family History  Problem Relation Age of Onset  . Heart failure Father   . Hypertension Father   . Diabetes Father   . Anxiety disorder Father   . Endometriosis Mother   . Depression Mother   . COPD Mother   . Anxiety disorder Mother     Social History   Tobacco Use  . Smoking status: Former Smoker    Packs/day: 0.30    Types: Cigarettes    Quit date: 05/05/2019    Years since quitting: 0.5  . Smokeless tobacco: Never Used  Vaping Use  . Vaping Use: Never used  Substance Use Topics  . Alcohol use: Not Currently    Comment: Occ  . Drug use: Not Currently     Types: Marijuana    Comment: last use was 05/05/2019    Allergies:  Allergies  Allergen Reactions  . Sulfur     Unknown rxn    No medications prior to admission.    Review of Systems  Constitutional: Negative for chills and fever.  Gastrointestinal: Positive for abdominal pain.  Genitourinary: Negative for dysuria, hematuria, urgency, vaginal bleeding and vaginal discharge.  Musculoskeletal: Positive for back pain.   Physical Exam   Blood pressure 119/70, pulse 83, temperature 98.4 F (36.9 C), temperature source Oral, resp. rate 18, last menstrual period 04/03/2019, SpO2 99 %.  Physical Exam Vitals and nursing note reviewed. Exam conducted with a chaperone present.  Constitutional:      General: She is not in acute distress.    Appearance: Normal appearance.  HENT:     Head: Normocephalic and atraumatic.  Cardiovascular:     Rate and Rhythm: Normal rate.  Pulmonary:  Effort: Pulmonary effort is normal. No respiratory distress.  Abdominal:     Tenderness: There is no right CVA tenderness or left CVA tenderness.  Genitourinary:    Comments: VE: closed/thick Musculoskeletal:        General: Normal range of motion.     Cervical back: Normal and normal range of motion.     Thoracic back: Normal.     Lumbar back: Tenderness present.       Back:  Skin:    General: Skin is warm and dry.  Neurological:     General: No focal deficit present.     Mental Status: She is alert and oriented to person, place, and time.  Psychiatric:        Mood and Affect: Mood normal.        Behavior: Behavior normal.    EFM: 140 bpm, mod variability, + accels, no decels Toco: UI  Results for orders placed or performed during the hospital encounter of 11/20/19 (from the past 24 hour(s))  Urinalysis, Routine w reflex microscopic Urine, Clean Catch     Status: None   Collection Time: 11/20/19  9:08 AM  Result Value Ref Range   Color, Urine YELLOW YELLOW   APPearance CLEAR CLEAR    Specific Gravity, Urine 1.023 1.005 - 1.030   pH 6.0 5.0 - 8.0   Glucose, UA NEGATIVE NEGATIVE mg/dL   Hgb urine dipstick NEGATIVE NEGATIVE   Bilirubin Urine NEGATIVE NEGATIVE   Ketones, ur NEGATIVE NEGATIVE mg/dL   Protein, ur NEGATIVE NEGATIVE mg/dL   Nitrite NEGATIVE NEGATIVE   Leukocytes,Ua NEGATIVE NEGATIVE  Wet prep, genital     Status: Abnormal   Collection Time: 11/20/19 10:17 AM  Result Value Ref Range   Yeast Wet Prep HPF POC PRESENT (A) NONE SEEN   Trich, Wet Prep NONE SEEN NONE SEEN   Clue Cells Wet Prep HPF POC NONE SEEN NONE SEEN   WBC, Wet Prep HPF POC MANY (A) NONE SEEN   Sperm NONE SEEN    MAU Course  Procedures Heating pad Flexeril  MDM Labs ordered and reviewed. No evidence of UTI or PTL. Pain improved. Sx likely MSK, discussed comfort measures. +yeast on wet prep, pt asymptomatic, will not treat. Stable for discharge home.   Assessment and Plan   1. [redacted] weeks gestation of pregnancy   2. NST (non-stress test) reactive   3. Musculoskeletal back pain   4. Pain of round ligament during pregnancy    Discharge home Follow up at [facility] as scheduled Rx Flexeril Heat pad prn PTL precautions Allergies as of 11/20/2019      Reactions   Sulfur    Unknown rxn      Medication List    STOP taking these medications   metoCLOPramide 10 MG tablet Commonly known as: REGLAN   ondansetron 4 MG tablet Commonly known as: ZOFRAN   polyethylene glycol powder 17 GM/SCOOP powder Commonly known as: GLYCOLAX/MIRALAX     TAKE these medications   Accu-Chek Guide test strip Generic drug: glucose blood Use four times per day   Accu-Chek Softclix Lancets lancets Use four times per day   acetaminophen 500 MG tablet Commonly known as: TYLENOL Take 2 tablets (1,000 mg total) by mouth every 8 (eight) hours as needed.   aspirin EC 81 MG tablet Take 1 tablet (81 mg total) by mouth daily. Take after 12 weeks for prevention of preeclampsia later in pregnancy   Blood  Pressure Kit Devi 1 Device by Does not  apply route as needed.   cyclobenzaprine 10 MG tablet Commonly known as: FLEXERIL Take 1 tablet (10 mg total) by mouth 3 (three) times daily as needed (back pain). What changed:   when to take this  reasons to take this   PRENATAL VITAMIN PO Take 1 tablet by mouth daily.   promethazine 12.5 MG tablet Commonly known as: PHENERGAN Take 1 tablet (12.5 mg total) by mouth every 6 (six) hours as needed for nausea or vomiting.       Julianne Handler, CNM 11/20/2019, 12:03 PM

## 2019-11-20 NOTE — MAU Note (Signed)
Pt stated she had been having lower back pain and pain in both her hips since Monday. Taking tylenol and resting but not relieving the pain. Pain got worse this morning. Stated she Fractured her pelvis during her last pregnancy. "Not sure what is going on now."  Good fetal movement felt and denies any vag bleeding or discharge at this time.

## 2019-11-20 NOTE — MAU Provider Note (Addendum)
History    CSN: [id number]  Arrival date and time: 11/20/19 4665   First Provider Initiated Contact with Patient 11/20/19 0906      No chief complaint on file.  HPI  Patty Smith is a 29 yo G2P1001 with A1GDM at 56w0dwho presents today for low back pain. She reports that this began intermittently Tuesday (11/16) without significant affect on her ability to function day-to-day. Starting this AM around 3:30 she woke up with worsened pain. She reports her pain is now constant and dull in her lower back bilaterally. She also has sharp radiating pain from her back into her lower lumbar area and into her pelvis. She states that this is 8/10 at rest and 10/10 with walking or adjusting in bed. There was little improvement in her pain after Tylenol this morning. Has not attempted heating pad. Uses a maternity belt daily. She denies any trauma in the last several days. She reports chronic low back pain for several years that feels similar to her current pain though it is far more severe this morning.   Additionally she notes some lower abdominal pressure that radiates into her pelvis near the site of her prior c-section incision. Has also been experiencing what she believes are braxton-hicks contractions for 2-3 weeks. She feels a tightening near her belly button that subsides shortly after. She has approximately 6-7 of these/day and has noticed no change or worsening of this today. She denies any weakness or sensory changes in her legs, loss of bowel or bladder control, fever, chills, body aches, or immunosuppressant therapy.  She [place]   Denies dysuria, hematuria, LOF, VB, mucus, discharge, recent sexual intercourse.   Past Medical History:  Diagnosis Date  . Anxiety   . Complication of anesthesia    epidural /or spinal didin't work- stuck many times  . Depression    doing ok  .  Endometriosis    not officially diagnosed by surgery or [place] treated with ocp's   . Infection    UTI  . Scarlet fever 2001  . Viral meningitis     Past Surgical History:  Procedure Laterality Date  . CESAREAN SECTION      Family History  Problem Relation Age of Onset  . Heart failure Father   . Hypertension Father   . Diabetes Father   . Anxiety disorder Father   . Endometriosis Mother   . Depression Mother   . COPD Mother   . Anxiety disorder Mother     Social History   Tobacco Use  . Smoking status: Former Smoker    Packs/day: 0.30    Types: Cigarettes    Quit date: 05/05/2019    Years since quitting: 0.5  . Smokeless tobacco: Never Used  Vaping Use  . Vaping Use: Never used  Substance Use Topics  . Alcohol use: Not Currently    Comment: Occ  . Drug use: Not Currently    Types: Marijuana    Comment: last use was 05/05/2019    Allergies:  Allergies  Allergen Reactions  . Sulfur     Unknown rxn    Medications Prior to Admission  Medication Sig Dispense Refill Last Dose  . acetaminophen (TYLENOL) 500 MG tablet Take 2 tablets (1,000 mg total) by mouth every 8 (eight) hours as needed. 30 tablet 0 11/20/2019 at Unknown time  . aspirin EC 81 MG  tablet Take 1 tablet (81 mg total) by mouth daily. Take after 12 weeks for prevention of preeclampsia later in pregnancy 300 tablet 2 11/19/2019 at Unknown time  . cyclobenzaprine (FLEXERIL) 10 MG tablet Take 1 tablet (10 mg total) by mouth 2 (two) times daily as needed for muscle spasms. 20 tablet 0 Past Week at Unknown time  . Accu-Chek Softclix Lancets lancets Use four times per day 100 each 12   . Blood Pressure Monitoring (BLOOD PRESSURE KIT) DEVI 1 Device by Does not apply route as needed. 1 each 0   . glucose blood (ACCU-CHEK GUIDE) test strip Use four times per day 100 each 12   . metoCLOPramide (REGLAN) 10 MG tablet Take 1 tablet (10 mg total) by mouth every 6 (six) hours. (Patient not taking: Reported on  10/01/2019) 20 tablet 0   . ondansetron (ZOFRAN) 4 MG tablet Take 1 tablet (4 mg total) by mouth every 8 (eight) hours as needed for nausea or vomiting. (Patient not taking: Reported on 05/29/2019) 16 tablet 0   . polyethylene glycol powder (GLYCOLAX/MIRALAX) 17 GM/SCOOP powder Take 17 g by mouth daily as needed. (Patient not taking: Reported on 10/29/2019) 510 g 1   . Prenatal Vit-Fe Fumarate-FA (PRENATAL VITAMIN PO) Take 1 tablet by mouth daily.     . promethazine (PHENERGAN) 12.5 MG tablet Take 1 tablet (12.5 mg total) by mouth every 6 (six) hours as needed for nausea or vomiting. 30 tablet 0     Review of Systems  Constitutional: Negative for chills and fever.  HENT: Positive for congestion. Negative for sore throat.   Eyes: Negative for photophobia, pain, discharge, redness and itching.  Respiratory: Negative for cough, chest tightness and shortness of breath.   Cardiovascular: Negative for chest pain and palpitations.  Genitourinary: Positive for frequency. Negative for difficulty urinating, dysuria, flank pain, vaginal bleeding, vaginal discharge and vaginal pain.  Musculoskeletal: Positive for back pain.  Skin: Negative for color change and rash.  Neurological: Negative for dizziness, numbness and headaches.  Hematological: Does not bruise/bleed easily.  Psychiatric/Behavioral: Positive for sleep disturbance.   Physical Exam   Blood pressure 122/75, pulse 91, temperature 98.4 F (36.9 C), temperature source Oral, resp. rate 20, last menstrual period 04/03/2019, SpO2 99 %.  Physical Exam Constitutional:      Appearance: Normal appearance.     Comments: Comfortable at rest, pain on movement  HENT:     Head: Normocephalic and atraumatic.  Cardiovascular:     Rate and Rhythm: Normal rate and regular rhythm.     Pulses: Normal pulses.     Heart sounds: Normal heart sounds.  Pulmonary:     Effort: Pulmonary effort is normal.     Breath sounds: Normal breath sounds.  Abdominal:      Palpations: Abdomen is soft.     Tenderness: There is no right CVA tenderness, left CVA tenderness or guarding.  Genitourinary:    Comments: Swabs obtained, cervix thick and closed Musculoskeletal:     Comments: Trace pitting edema bilaterally Tenderness to palpation bilateral lower lumbar paraspinals.  Skin:    General: Skin is warm and dry.  Neurological:     General: No focal deficit present.     Mental Status: She is alert and oriented to person, place, and time.    Fetal wellbeing: Category 1  MAU Course  Procedures  Results for orders placed or performed during the hospital encounter of 11/20/19 (from the past 24 hour(s))  Urinalysis, Routine w reflex microscopic  Urine, Clean Catch     Status: None   Collection Time: 11/20/19  9:08 AM  Result Value Ref Range   Color, Urine YELLOW YELLOW   APPearance CLEAR CLEAR   Specific Gravity, Urine 1.023 1.005 - 1.030   pH 6.0 5.0 - 8.0   Glucose, UA NEGATIVE NEGATIVE mg/dL   Hgb urine dipstick NEGATIVE NEGATIVE   Bilirubin Urine NEGATIVE NEGATIVE   Ketones, ur NEGATIVE NEGATIVE mg/dL   Protein, ur NEGATIVE NEGATIVE mg/dL   Nitrite NEGATIVE NEGATIVE   Leukocytes,Ua NEGATIVE NEGATIVE  Wet prep, genital     Status: Abnormal   Collection Time: 11/20/19 10:17 AM  Result Value Ref Range   Yeast Wet Prep HPF POC PRESENT (A) NONE SEEN   Trich, Wet Prep NONE SEEN NONE SEEN   Clue Cells Wet Prep HPF POC NONE SEEN NONE SEEN   WBC, Wet Prep HPF POC MANY (A) NONE SEEN   Sperm NONE SEEN    MDM High suspicion for MSK etiology. Unlikely preterm labor given lack of contractions and thick closed cervix. Unlikely UTI/pyelo or nephrolithiasis given clear urinalysis, lack of urinary symptoms, CVA tenderness & fever. Swabs for wet prep and GC/chlamydia obtained   Assessment and Plan  Evelette Hollern is a 29 yo G2P1001 at 75w0dwith A1GDM who presents today for low back pain found to have acute on chronic low back pain and round ligament  pain.   #Low Back Pain During Pregnancy Pt is experiencing constant low back pain since this AM which has worsened since Tuesday. Hx of chronic low back pain with similar symptoms. No red flag symptoms at this time.  Pt experienced improvement in her pain from 8/10 to 5/10 1 hour s/p flexeril and heating pads. Pt reports she is able to get a ride home from the hospital today. -continue heating pad PRN -Flexeril 10 mg TID PRN  #Round Ligament Pain Sharp pain radiating from her lower abdomen into her pelvis toward her vagina. Worsened with movement. Currently using maternity belt. Improved with flexeril and rest. -continue maternity belt usage -Flexeril -consider stretches and/or PT if no improvement  #Vaginal Candidiasis Yeast and WBC seen on wet prep. Pt denies vaginal discharge, burning, pain, or irritation. Notes she had a yeast infection at the beginning of her pregnancy and is not experiencing any similar symptoms. Unlikely cause of low back and pelvic pain. -Counseled that if she develops symptoms in the next several days that this can be easily treated -f/u GC/chlamydia  [facility]: [facility] Flexeril 10 mg TID, PRN       Tylenol OTC PRN Return precautions discussed including vaginal bleeding, LOF, contractions, abdominal pain, worsening back pain, or urinary sxs such as pain or blood.   NHart CarwinA [place] 11/20/2019, 9:33 AM   I supervised the student during this patient encounter. Please see my provider note for complete documentation.   BJulianne Handler CNM 11/20/2019 11:50 AM

## 2019-11-20 NOTE — Progress Notes (Signed)
Per Fabian November, CMW, Pt taken off cont FM.   Yanelle Sousa M Timmia Cogburn RNC-OB, MAU

## 2019-11-21 LAB — GC/CHLAMYDIA PROBE AMP (~~LOC~~) NOT AT ARMC
Chlamydia: NEGATIVE
Comment: NEGATIVE
Comment: NORMAL
Neisseria Gonorrhea: NEGATIVE

## 2019-11-26 ENCOUNTER — Other Ambulatory Visit: Payer: Self-pay

## 2019-11-26 ENCOUNTER — Ambulatory Visit (INDEPENDENT_AMBULATORY_CARE_PROVIDER_SITE_OTHER): Payer: Medicaid Other | Admitting: Family Medicine

## 2019-11-26 ENCOUNTER — Encounter: Payer: Self-pay | Admitting: Family Medicine

## 2019-11-26 VITALS — BP 122/65 | HR 101 | Wt 229.8 lb

## 2019-11-26 DIAGNOSIS — O99210 Obesity complicating pregnancy, unspecified trimester: Secondary | ICD-10-CM

## 2019-11-26 DIAGNOSIS — Z98891 History of uterine scar from previous surgery: Secondary | ICD-10-CM

## 2019-11-26 DIAGNOSIS — O0990 Supervision of high risk pregnancy, unspecified, unspecified trimester: Secondary | ICD-10-CM

## 2019-11-26 DIAGNOSIS — O24410 Gestational diabetes mellitus in pregnancy, diet controlled: Secondary | ICD-10-CM

## 2019-11-26 NOTE — Patient Instructions (Signed)
 Third Trimester of Pregnancy The third trimester is from week 28 through week 40 (months 7 through 9). The third trimester is a time when the unborn baby (fetus) is growing rapidly. At the end of the ninth month, the fetus is about 20 inches in length and weighs 6-10 pounds. Body changes during your third trimester Your body will continue to go through many changes during pregnancy. The changes vary from woman to woman. During the third trimester:  Your weight will continue to increase. You can expect to gain 25-35 pounds (11-16 kg) by the end of the pregnancy.  You may begin to get stretch marks on your hips, abdomen, and breasts.  You may urinate more often because the fetus is moving lower into your pelvis and pressing on your bladder.  You may develop or continue to have heartburn. This is caused by increased hormones that slow down muscles in the digestive tract.  You may develop or continue to have constipation because increased hormones slow digestion and cause the muscles that push waste through your intestines to relax.  You may develop hemorrhoids. These are swollen veins (varicose veins) in the rectum that can itch or be painful.  You may develop swollen, bulging veins (varicose veins) in your legs.  You may have increased body aches in the pelvis, back, or thighs. This is due to weight gain and increased hormones that are relaxing your joints.  You may have changes in your hair. These can include thickening of your hair, rapid growth, and changes in texture. Some women also have hair loss during or after pregnancy, or hair that feels dry or thin. Your hair will most likely return to normal after your baby is born.  Your breasts will continue to grow and they will continue to become tender. A yellow fluid (colostrum) may leak from your breasts. This is the first milk you are producing for your baby.  Your belly button may stick out.  You may notice more swelling in your  hands, face, or ankles.  You may have increased tingling or numbness in your hands, arms, and legs. The skin on your belly may also feel numb.  You may feel short of breath because of your expanding uterus.  You may have more problems sleeping. This can be caused by the size of your belly, increased need to urinate, and an increase in your body's metabolism.  You may notice the fetus "dropping," or moving lower in your abdomen (lightening).  You may have increased vaginal discharge.  You may notice your joints feel loose and you may have pain around your pelvic bone. What to expect at prenatal visits You will have prenatal exams every 2 weeks until week 36. Then you will have weekly prenatal exams. During a routine prenatal visit:  You will be weighed to make sure you and the baby are growing normally.  Your blood pressure will be taken.  Your abdomen will be measured to track your baby's growth.  The fetal heartbeat will be listened to.  Any test results from the previous visit will be discussed.  You may have a cervical check near your due date to see if your cervix has softened or thinned (effaced).  You will be tested for Group B streptococcus. This happens between 35 and 37 weeks. Your health care provider may ask you:  What your birth plan is.  How you are feeling.  If you are feeling the baby move.  If you have had any   abnormal symptoms, such as leaking fluid, bleeding, severe headaches, or abdominal cramping.  If you are using any tobacco products, including cigarettes, chewing tobacco, and electronic cigarettes.  If you have any questions. Other tests or screenings that may be performed during your third trimester include:  Blood tests that check for low iron levels (anemia).  Fetal testing to check the health, activity level, and growth of the fetus. Testing is done if you have certain medical conditions or if there are problems during the  pregnancy.  Nonstress test (NST). This test checks the health of your baby to make sure there are no signs of problems, such as the baby not getting enough oxygen. During this test, a belt is placed around your belly. The baby is made to move, and its heart rate is monitored during movement. What is false labor? False labor is a condition in which you feel small, irregular tightenings of the muscles in the womb (contractions) that usually go away with rest, changing position, or drinking water. These are called Braxton Hicks contractions. Contractions may last for hours, days, or even weeks before true labor sets in. If contractions come at regular intervals, become more frequent, increase in intensity, or become painful, you should see your health care provider. What are the signs of labor?  Abdominal cramps.  Regular contractions that start at 10 minutes apart and become stronger and more frequent with time.  Contractions that start on the top of the uterus and spread down to the lower abdomen and back.  Increased pelvic pressure and dull back pain.  A watery or bloody mucus discharge that comes from the vagina.  Leaking of amniotic fluid. This is also known as your "water breaking." It could be a slow trickle or a gush. Let your health care provider know if it has a color or strange odor. If you have any of these signs, call your health care provider right away, even if it is before your due date. Follow these instructions at home: Medicines  Follow your health care provider's instructions regarding medicine use. Specific medicines may be either safe or unsafe to take during pregnancy.  Take a prenatal vitamin that contains at least 600 micrograms (mcg) of folic acid.  If you develop constipation, try taking a stool softener if your health care provider approves. Eating and drinking   Eat a balanced diet that includes fresh fruits and vegetables, whole grains, good sources of protein  such as meat, eggs, or tofu, and low-fat dairy. Your health care provider will help you determine the amount of weight gain that is right for you.  Avoid raw meat and uncooked cheese. These carry germs that can cause birth defects in the baby.  If you have low calcium intake from food, talk to your health care provider about whether you should take a daily calcium supplement.  Eat four or five small meals rather than three large meals a day.  Limit foods that are high in fat and processed sugars, such as fried and sweet foods.  To prevent constipation: ? Drink enough fluid to keep your urine clear or pale yellow. ? Eat foods that are high in fiber, such as fresh fruits and vegetables, whole grains, and beans. Activity  Exercise only as directed by your health care provider. Most women can continue their usual exercise routine during pregnancy. Try to exercise for 30 minutes at least 5 days a week. Stop exercising if you experience uterine contractions.  Avoid heavy lifting.    Do not exercise in extreme heat or humidity, or at high altitudes.  Wear low-heel, comfortable shoes.  Practice good posture.  You may continue to have sex unless your health care provider tells you otherwise. Relieving pain and discomfort  Take frequent breaks and rest with your legs elevated if you have leg cramps or low back pain.  Take warm sitz baths to soothe any pain or discomfort caused by hemorrhoids. Use hemorrhoid cream if your health care provider approves.  Wear a good support bra to prevent discomfort from breast tenderness.  If you develop varicose veins: ? Wear support pantyhose or compression stockings as told by your healthcare provider. ? Elevate your feet for 15 minutes, 3-4 times a day. Prenatal care  Write down your questions. Take them to your prenatal visits.  Keep all your prenatal visits as told by your health care provider. This is important. Safety  Wear your seat belt at  all times when driving.  Make a list of emergency phone numbers, including numbers for family, friends, the hospital, and police and fire departments. General instructions  Avoid cat litter boxes and soil used by cats. These carry germs that can cause birth defects in the baby. If you have a cat, ask someone to clean the litter box for you.  Do not travel far distances unless it is absolutely necessary and only with the approval of your health care provider.  Do not use hot tubs, steam rooms, or saunas.  Do not drink alcohol.  Do not use any products that contain nicotine or tobacco, such as cigarettes and e-cigarettes. If you need help quitting, ask your health care provider.  Do not use any medicinal herbs or unprescribed drugs. These chemicals affect the formation and growth of the baby.  Do not douche or use tampons or scented sanitary pads.  Do not cross your legs for long periods of time.  To prepare for the arrival of your baby: ? Take prenatal classes to understand, practice, and ask questions about labor and delivery. ? Make a trial run to the hospital. ? Visit the hospital and tour the maternity area. ? Arrange for maternity or paternity leave through employers. ? Arrange for family and friends to take care of pets while you are in the hospital. ? Purchase a rear-facing car seat and make sure you know how to install it in your car. ? Pack your hospital bag. ? Prepare the baby's nursery. Make sure to remove all pillows and stuffed animals from the baby's crib to prevent suffocation.  Visit your dentist if you have not gone during your pregnancy. Use a soft toothbrush to brush your teeth and be gentle when you floss. Contact a health care provider if:  You are unsure if you are in labor or if your water has broken.  You become dizzy.  You have mild pelvic cramps, pelvic pressure, or nagging pain in your abdominal area.  You have lower back pain.  You have persistent  nausea, vomiting, or diarrhea.  You have an unusual or bad smelling vaginal discharge.  You have pain when you urinate. Get help right away if:  Your water breaks before 37 weeks.  You have regular contractions less than 5 minutes apart before 37 weeks.  You have a fever.  You are leaking fluid from your vagina.  You have spotting or bleeding from your vagina.  You have severe abdominal pain or cramping.  You have rapid weight loss or weight gain.  You   have shortness of breath with chest pain.  You notice sudden or extreme swelling of your face, hands, ankles, feet, or legs.  Your baby makes fewer than 10 movements in 2 hours.  You have severe headaches that do not go away when you take medicine.  You have vision changes. Summary  The third trimester is from week 28 through week 40, months 7 through 9. The third trimester is a time when the unborn baby (fetus) is growing rapidly.  During the third trimester, your discomfort may increase as you and your baby continue to gain weight. You may have abdominal, leg, and back pain, sleeping problems, and an increased need to urinate.  During the third trimester your breasts will keep growing and they will continue to become tender. A yellow fluid (colostrum) may leak from your breasts. This is the first milk you are producing for your baby.  False labor is a condition in which you feel small, irregular tightenings of the muscles in the womb (contractions) that eventually go away. These are called Braxton Hicks contractions. Contractions may last for hours, days, or even weeks before true labor sets in.  Signs of labor can include: abdominal cramps; regular contractions that start at 10 minutes apart and become stronger and more frequent with time; watery or bloody mucus discharge that comes from the vagina; increased pelvic pressure and dull back pain; and leaking of amniotic fluid. This information is not intended to replace advice  given to you by your health care provider. Make sure you discuss any questions you have with your health care provider. Document Revised: 04/11/2018 Document Reviewed: 01/25/2016 Else[employer] Patient Education  2020 [employer]   Contraception Choices Contraception, also called birth control, refers to methods or devices that prevent pregnancy. Hormonal methods Contraceptive implant  A contraceptive implant is a thin, plastic tube that contains a hormone. It is inserted into the upper part of the arm. It can remain in place for up to 3 years. Progestin-only injections Progestin-only injections are injections of progestin, a synthetic form of the hormone progesterone. They are given every 3 months by a health care provider. Birth control pills  Birth control pills are pills that contain hormones that prevent pregnancy. They must be taken once a day, preferably at the same time each day. Birth control patch  The birth control patch contains hormones that prevent pregnancy. It is placed on the skin and must be changed once a week for three weeks and removed on the fourth week. A prescription is needed to use this method of contraception. Vaginal ring  A vaginal ring contains hormones that prevent pregnancy. It is placed in the vagina for three weeks and removed on the fourth week. After that, the process is repeated with a new ring. A prescription is needed to use this method of contraception. Emergency contraceptive Emergency contraceptives prevent pregnancy after unprotected sex. They come in pill form and can be taken up to 5 days after sex. They work best the sooner they are taken after having sex. Most emergency contraceptives are available without a prescription. This method should not be used as your only form of birth control. Barrier methods Female condom  A female condom is a thin sheath that is worn over the penis during sex. Condoms keep sperm from going inside a woman's body. They can  be used with a spermicide to increase their effectiveness. They should be disposed after a single use. Female condom  A female condom is a soft,   loose-fitting sheath that is put into the vagina before sex. The condom keeps sperm from going inside a woman's body. They should be disposed after a single use. Diaphragm  A diaphragm is a soft, dome-shaped barrier. It is inserted into the vagina before sex, along with a spermicide. The diaphragm blocks sperm from entering the uterus, and the spermicide kills sperm. A diaphragm should be left in the vagina for 6-8 hours after sex and removed within 24 hours. A diaphragm is prescribed and fitted by a health care provider. A diaphragm should be replaced every 1-2 years, after giving birth, after gaining more than 15 lb (6.8 kg), and after pelvic surgery. Cervical cap  A cervical cap is a round, soft latex or plastic cup that fits over the cervix. It is inserted into the vagina before sex, along with spermicide. It blocks sperm from entering the uterus. The cap should be left in place for 6-8 hours after sex and removed within 48 hours. A cervical cap must be prescribed and fitted by a health care provider. It should be replaced every 2 years. Sponge  A sponge is a soft, circular piece of polyurethane foam with spermicide on it. The sponge helps block sperm from entering the uterus, and the spermicide kills sperm. To use it, you make it wet and then insert it into the vagina. It should be inserted before sex, left in for at least 6 hours after sex, and removed and thrown away within 30 hours. Spermicides Spermicides are chemicals that kill or block sperm from entering the cervix and uterus. They can come as a cream, jelly, suppository, foam, or tablet. A spermicide should be inserted into the vagina with an applicator at least 10-15 minutes before sex to allow time for it to work. The process must be repeated every time you have sex. Spermicides do not require  a prescription. Intrauterine contraception Intrauterine device (IUD) An IUD is a T-shaped device that is put in a woman's uterus. There are two types:  Hormone IUD.This type contains progestin, a synthetic form of the hormone progesterone. This type can stay in place for 3-5 years.  Copper IUD.This type is wrapped in copper wire. It can stay in place for 10 years.  Permanent methods of contraception Female tubal ligation In this method, a woman's fallopian tubes are sealed, tied, or blocked during surgery to prevent eggs from traveling to the uterus. Hysteroscopic sterilization In this method, a small, flexible insert is placed into each fallopian tube. The inserts cause scar tissue to form in the fallopian tubes and block them, so sperm cannot reach an egg. The procedure takes about 3 months to be effective. Another form of birth control must be used during those 3 months. Female sterilization This is a procedure to tie off the tubes that carry sperm (vasectomy). After the procedure, the man can still ejaculate fluid (semen). Natural planning methods Natural family planning In this method, a couple does not have sex on days when the woman could become pregnant. Calendar method This means keeping track of the length of each menstrual cycle, identifying the days when pregnancy can happen, and not having sex on those days. Ovulation method In this method, a couple avoids sex during ovulation. Symptothermal method This method involves not having sex during ovulation. The woman typically checks for ovulation by watching changes in her temperature and in the consistency of cervical mucus. Post-ovulation method In this method, a couple waits to have sex until after ovulation. Summary    Contraception, also called birth control, means methods or devices that prevent pregnancy.  Hormonal methods of contraception include implants, injections, pills, patches, vaginal rings, and emergency  contraceptives.  Barrier methods of contraception can include female condoms, female condoms, diaphragms, cervical caps, sponges, and spermicides.  There are two types of IUDs (intrauterine devices). An IUD can be put in a woman's uterus to prevent pregnancy for 3-5 years.  Permanent sterilization can be done through a procedure for males, females, or both.  Natural family planning methods involve not having sex on days when the woman could become pregnant. This information is not intended to replace advice given to you by your health care provider. Make sure you discuss any questions you have with your health care provider. Document Revised: 12/21/2016 Document Reviewed: 01/22/2016 Else[employer] Patient Education  2020 [employer]   Breastfeeding  Choosing to breastfeed is one of the best decisions you can make for yourself and your baby. A change in hormones during pregnancy causes your breasts to make breast milk in your milk-producing glands. Hormones prevent breast milk from being released before your baby is born. They also prompt milk flow after birth. Once breastfeeding has begun, thoughts of your baby, as well as his or her sucking or crying, can stimulate the release of milk from your milk-producing glands. Benefits of breastfeeding Research shows that breastfeeding offers many health benefits for infants and mothers. It also offers a cost-free and convenient way to feed your baby. For your baby  Your first milk (colostrum) helps your baby's digestive system to function better.  Special cells in your milk (antibodies) help your baby to fight off infections.  Breastfed babies are less likely to develop asthma, allergies, obesity, or type 2 diabetes. They are also at lower risk for sudden infant death syndrome (SIDS).  Nutrients in breast milk are better able to meet your baby's needs compared to infant formula.  Breast milk improves your baby's brain development. For  you  Breastfeeding helps to create a very special bond between you and your baby.  Breastfeeding is convenient. Breast milk costs nothing and is always available at the correct temperature.  Breastfeeding helps to burn calories. It helps you to lose the weight that you gained during pregnancy.  Breastfeeding makes your uterus return faster to its size before pregnancy. It also slows bleeding (lochia) after you give birth.  Breastfeeding helps to lower your risk of developing type 2 diabetes, osteoporosis, rheumatoid arthritis, cardiovascular disease, and breast, ovarian, uterine, and endometrial cancer later in life. Breastfeeding basics Starting breastfeeding  Find a comfortable place to sit or lie down, with your neck and back well-supported.  Place a pillow or a rolled-up blanket under your baby to bring him or her to the level of your breast (if you are seated). Nursing pillows are specially designed to help support your arms and your baby while you breastfeed.  Make sure that your baby's tummy (abdomen) is facing your abdomen.  Gently massage your breast. With your fingertips, massage from the outer edges of your breast inward toward the nipple. This encourages milk flow. If your milk flows slowly, you may need to continue this action during the feeding.  Support your breast with 4 fingers underneath and your thumb above your nipple (make the letter "C" with your hand). Make sure your fingers are well away from your nipple and your baby's mouth.  Stroke your baby's lips gently with your finger or nipple.  When your baby's mouth is open   wide enough, quickly bring your baby to your breast, placing your entire nipple and as much of the areola as possible into your baby's mouth. The areola is the colored area around your nipple. ? More areola should be visible above your baby's upper lip than below the lower lip. ? Your baby's lips should be opened and extended outward (flanged) to  ensure an adequate, comfortable latch. ? Your baby's tongue should be between his or her lower gum and your breast.  Make sure that your baby's mouth is correctly positioned around your nipple (latched). Your baby's lips should create a seal on your breast and be turned out (everted).  It is common for your baby to suck about 2-3 minutes in order to start the flow of breast milk. Latching Teaching your baby how to latch onto your breast properly is very important. An improper latch can cause nipple pain, decreased milk supply, and poor weight gain in your baby. Also, if your baby is not latched onto your nipple properly, he or she may swallow some air during feeding. This can make your baby fussy. Burping your baby when you switch breasts during the feeding can help to get rid of the air. However, teaching your baby to latch on properly is still the best way to prevent fussiness from swallowing air while breastfeeding. Signs that your baby has successfully latched onto your nipple  Silent tugging or silent sucking, without causing you pain. Infant's lips should be extended outward (flanged).  Swallowing heard between every 3-4 sucks once your milk has started to flow (after your let-down milk reflex occurs).  Muscle movement above and in front of his or her ears while sucking. Signs that your baby has not successfully latched onto your nipple  Sucking sounds or smacking sounds from your baby while breastfeeding.  Nipple pain. If you think your baby has not latched on correctly, slip your finger into the corner of your baby's mouth to break the suction and place it between your baby's gums. Attempt to start breastfeeding again. Signs of successful breastfeeding Signs from your baby  Your baby will gradually decrease the number of sucks or will completely stop sucking.  Your baby will fall asleep.  Your baby's body will relax.  Your baby will retain a small amount of milk in his or her  mouth.  Your baby will let go of your breast by himself or herself. Signs from you  Breasts that have increased in firmness, weight, and size 1-3 hours after feeding.  Breasts that are softer immediately after breastfeeding.  Increased milk volume, as well as a change in milk consistency and color by the fifth day of breastfeeding.  Nipples that are not sore, cracked, or bleeding. Signs that your baby is getting enough milk  Wetting at least 1-2 diapers during the first 24 hours after birth.  Wetting at least 5-6 diapers every 24 hours for the first week after birth. The urine should be clear or pale yellow by the age of 5 days.  Wetting 6-8 diapers every 24 hours as your baby continues to grow and develop.  At least 3 stools in a 24-hour period by the age of 5 days. The stool should be soft and yellow.  At least 3 stools in a 24-hour period by the age of 7 days. The stool should be seedy and yellow.  No loss of weight greater than 10% of birth weight during the first 3 days of life.  Average weight gain   of 4-7 oz (113-198 g) per week after the age of 4 days.  Consistent daily weight gain by the age of 5 days, without weight loss after the age of 2 weeks. After a feeding, your baby may spit up a small amount of milk. This is normal. Breastfeeding frequency and duration Frequent feeding will help you make more milk and can prevent sore nipples and extremely full breasts (breast engorgement). Breastfeed when you feel the need to reduce the fullness of your breasts or when your baby shows signs of hunger. This is called "breastfeeding on demand." Signs that your baby is hungry include:  Increased alertness, activity, or restlessness.  Movement of the head from side to side.  Opening of the mouth when the corner of the mouth or cheek is stroked (rooting).  Increased sucking sounds, smacking lips, cooing, sighing, or squeaking.  Hand-to-mouth movements and sucking on fingers or  hands.  Fussing or crying. Avoid introducing a pacifier to your baby in the first 4-6 weeks after your baby is born. After this time, you may choose to use a pacifier. Research has shown that pacifier use during the first year of a baby's life decreases the risk of sudden infant death syndrome (SIDS). Allow your baby to feed on each breast as long as he or she wants. When your baby unlatches or falls asleep while feeding from the first breast, offer the second breast. Because newborns are often sleepy in the first few weeks of life, you may need to awaken your baby to get him or her to feed. Breastfeeding times will vary from baby to baby. However, the following rules can serve as a guide to help you make sure that your baby is properly fed:  Newborns (babies 4 weeks of age or younger) may breastfeed every 1-3 hours.  Newborns should not go without breastfeeding for longer than 3 hours during the day or 5 hours during the night.  You should breastfeed your baby a minimum of 8 times in a 24-hour period. Breast milk pumping     Pumping and storing breast milk allows you to make sure that your baby is exclusively fed your breast milk, even at times when you are unable to breastfeed. This is especially important if you go back to work while you are still breastfeeding, or if you are not able to be present during feedings. Your lactation consultant can help you find a method of pumping that works best for you and give you guidelines about how long it is safe to store breast milk. Caring for your breasts while you breastfeed Nipples can become dry, cracked, and sore while breastfeeding. The following recommendations can help keep your breasts moisturized and healthy:  Avoid using soap on your nipples.  Wear a supportive bra designed especially for nursing. Avoid wearing underwire-style bras or extremely tight bras (sports bras).  Air-dry your nipples for 3-4 minutes after each feeding.  Use only  cotton bra pads to absorb leaked breast milk. Leaking of breast milk between feedings is normal.  Use lanolin on your nipples after breastfeeding. Lanolin helps to maintain your skin's normal moisture barrier. Pure lanolin is not harmful (not toxic) to your baby. You may also hand express a few drops of breast milk and gently massage that milk into your nipples and allow the milk to air-dry. In the first few weeks after giving birth, some women experience breast engorgement. Engorgement can make your breasts feel heavy, warm, and tender to the touch. Engorgement   peaks within 3-5 days after you give birth. The following recommendations can help to ease engorgement:  Completely empty your breasts while breastfeeding or pumping. You may want to start by applying warm, moist heat (in the shower or with warm, water-soaked hand towels) just before feeding or pumping. This increases circulation and helps the milk flow. If your baby does not completely empty your breasts while breastfeeding, pump any extra milk after he or she is finished.  Apply ice packs to your breasts immediately after breastfeeding or pumping, unless this is too uncomfortable for you. To do this: ? Put ice in a plastic bag. ? Place a towel between your skin and the bag. ? Leave the ice on for 20 minutes, 2-3 times a day.  Make sure that your baby is latched on and positioned properly while breastfeeding. If engorgement persists after 48 hours of following these recommendations, contact your health care provider or a lactation consultant. Overall health care recommendations while breastfeeding  Eat 3 healthy meals and 3 snacks every day. Well-nourished mothers who are breastfeeding need an additional 450-500 calories a day. You can meet this requirement by increasing the amount of a balanced diet that you eat.  Drink enough water to keep your urine pale yellow or clear.  Rest often, relax, and continue to take your prenatal vitamins  to prevent fatigue, stress, and low vitamin and mineral levels in your body (nutrient deficiencies).  Do not use any products that contain nicotine or tobacco, such as cigarettes and e-cigarettes. Your baby may be harmed by chemicals from cigarettes that pass into breast milk and exposure to secondhand smoke. If you need help quitting, ask your health care provider.  Avoid alcohol.  Do not use illegal drugs or marijuana.  Talk with your health care provider before taking any medicines. These include over-the-counter and prescription medicines as well as vitamins and herbal supplements. Some medicines that may be harmful to your baby can pass through breast milk.  It is possible to become pregnant while breastfeeding. If birth control is desired, ask your health care provider about options that will be safe while breastfeeding your baby. Where to find more information: [employer]: www.llli.org Contact a health care provider if:  You feel like you want to stop breastfeeding or have become frustrated with breastfeeding.  Your nipples are cracked or bleeding.  Your breasts are red, tender, or warm.  You have: ? Painful breasts or nipples. ? A swollen area on either breast. ? A fever or chills. ? Nausea or vomiting. ? Drainage other than breast milk from your nipples.  Your breasts do not become full before feedings by the fifth day after you give birth.  You feel sad and depressed.  Your baby is: ? Too sleepy to eat well. ? Having trouble sleeping. ? More than 1 week old and wetting fewer than 6 diapers in a 24-hour period. ? Not gaining weight by 5 days of age.  Your baby has fewer than 3 stools in a 24-hour period.  Your baby's skin or the white parts of his or her eyes become yellow. Get help right away if:  Your baby is overly tired (lethargic) and does not want to wake up and feed.  Your baby develops an unexplained fever. Summary  Breastfeeding  offers many health benefits for infant and mothers.  Try to breastfeed your infant when he or she shows early signs of hunger.  Gently tickle or stroke your baby's lips with   your finger or nipple to allow the baby to open his or her mouth. Bring the baby to your breast. Make sure that much of the areola is in your baby's mouth. Offer one side and burp the baby before you offer the other side.  Talk with your health care provider or lactation consultant if you have questions or you face problems as you breastfeed. This information is not intended to replace advice given to you by your health care provider. Make sure you discuss any questions you have with your health care provider. Document Revised: 03/15/2017 Document Reviewed: 01/21/2016 Elsevier Patient Education  2020 Elsevier Inc.  

## 2019-11-26 NOTE — Progress Notes (Signed)
   Subjective:  Patty Smith is a 29 y.o. G2P1001 at [redacted]w[redacted]d being seen today for ongoing prenatal care.  She is currently monitored for the following issues for this high-risk pregnancy and has TOBACCO USER; DEPRESSION; BACK PAIN, LUMBAR, CHRONIC; Supervision of high risk pregnancy, antepartum; Obesity in pregnancy; Anxiety; History of C-section; Asymptomatic bacteriuria; Alpha thalassemia silent carrier; Gestational diabetes; and [redacted] weeks gestation of pregnancy on their problem list.  Patient reports no complaints.  Contractions: Irritability. Vag. Bleeding: None.  Movement: Present. Denies leaking of fluid.   The following portions of the patient's history were reviewed and updated as appropriate: allergies, current medications, past family history, past medical history, past social history, past surgical history and problem list. Problem list updated.  Objective:   Vitals:   11/26/19 1611  BP: 122/65  Pulse: (!) 101  Weight: 229 lb 12.8 oz (104.2 kg)    Fetal Status: Fetal Heart Rate (bpm): 158   Movement: Present     General:  Alert, oriented and cooperative. Patient is in no acute distress.  Skin: Skin is warm and dry. No rash noted.   Cardiovascular: Normal heart rate noted  Respiratory: Normal respiratory effort, no problems with respiration noted  Abdomen: Soft, gravid, appropriate for gestational age. Pain/Pressure: Present     Pelvic: Vag. Bleeding: None     Cervical exam deferred        Extremities: Normal range of motion.  Edema: None  Mental Status: Normal mood and affect. Normal behavior. Normal judgment and thought content.   Urinalysis:      Assessment and Plan:  Pregnancy: G2P1001 at [redacted]w[redacted]d  1. Supervision of high risk pregnancy, antepartum BP and FHR normal  2. Diet controlled gestational diabetes mellitus (GDM) in third trimester Diet controlled Sugars 85% at goal Last growth normal on 11/11, has f/u scheduled for next month  3. History of C-section VBAC  consent signed 10/29/2019, still desires TOLAC Reports post dates pregnancy with emergency cesarean requiring general anesthesia Still do not have prenatal records from [facility] in [place], will send another ROI for op note   4. Obesity in pregnancy   Preterm labor symptoms and general obstetric precautions including but not limited to vaginal bleeding, contractions, leaking of fluid and fetal movement were reviewed in detail with the patient. Please refer to After Visit Summary for other counseling recommendations.  Return in 2 weeks (on 12/10/2019) for [facility].   Venora Maples, MD

## 2019-11-27 LAB — POCT URINALYSIS DIP (DEVICE)
Bilirubin Urine: NEGATIVE
Glucose, UA: NEGATIVE mg/dL
Hgb urine dipstick: NEGATIVE
Ketones, ur: NEGATIVE mg/dL
Leukocytes,Ua: NEGATIVE
Nitrite: NEGATIVE
Protein, ur: NEGATIVE mg/dL
Specific Gravity, Urine: 1.025 (ref 1.005–1.030)
Urobilinogen, UA: 1 mg/dL (ref 0.0–1.0)
pH: 7 (ref 5.0–8.0)

## 2019-12-10 ENCOUNTER — Ambulatory Visit: Payer: Medicaid Other | Attending: Obstetrics and Gynecology | Admitting: *Deleted

## 2019-12-10 ENCOUNTER — Ambulatory Visit: Payer: Medicaid Other

## 2019-12-10 ENCOUNTER — Encounter: Payer: Self-pay | Admitting: *Deleted

## 2019-12-10 ENCOUNTER — Ambulatory Visit (HOSPITAL_BASED_OUTPATIENT_CLINIC_OR_DEPARTMENT_OTHER): Payer: Medicaid Other

## 2019-12-10 ENCOUNTER — Other Ambulatory Visit: Payer: Self-pay

## 2019-12-10 DIAGNOSIS — O24410 Gestational diabetes mellitus in pregnancy, diet controlled: Secondary | ICD-10-CM | POA: Diagnosis not present

## 2019-12-10 DIAGNOSIS — E669 Obesity, unspecified: Secondary | ICD-10-CM

## 2019-12-10 DIAGNOSIS — Z148 Genetic carrier of other disease: Secondary | ICD-10-CM

## 2019-12-10 DIAGNOSIS — Z3A34 34 weeks gestation of pregnancy: Secondary | ICD-10-CM | POA: Diagnosis not present

## 2019-12-10 DIAGNOSIS — O99213 Obesity complicating pregnancy, third trimester: Secondary | ICD-10-CM | POA: Diagnosis not present

## 2019-12-10 DIAGNOSIS — R638 Other symptoms and signs concerning food and fluid intake: Secondary | ICD-10-CM

## 2019-12-10 DIAGNOSIS — O26843 Uterine size-date discrepancy, third trimester: Secondary | ICD-10-CM | POA: Insufficient documentation

## 2019-12-10 DIAGNOSIS — O24419 Gestational diabetes mellitus in pregnancy, unspecified control: Secondary | ICD-10-CM | POA: Insufficient documentation

## 2019-12-10 DIAGNOSIS — O0990 Supervision of high risk pregnancy, unspecified, unspecified trimester: Secondary | ICD-10-CM

## 2019-12-10 DIAGNOSIS — Z98891 History of uterine scar from previous surgery: Secondary | ICD-10-CM

## 2019-12-10 DIAGNOSIS — O34219 Maternal care for unspecified type scar from previous cesarean delivery: Secondary | ICD-10-CM

## 2019-12-10 DIAGNOSIS — F419 Anxiety disorder, unspecified: Secondary | ICD-10-CM

## 2019-12-10 DIAGNOSIS — O99210 Obesity complicating pregnancy, unspecified trimester: Secondary | ICD-10-CM

## 2019-12-12 ENCOUNTER — Encounter: Payer: Self-pay | Admitting: Family Medicine

## 2019-12-12 ENCOUNTER — Other Ambulatory Visit: Payer: Self-pay

## 2019-12-12 ENCOUNTER — Ambulatory Visit (INDEPENDENT_AMBULATORY_CARE_PROVIDER_SITE_OTHER): Payer: Medicaid Other | Admitting: Family Medicine

## 2019-12-12 VITALS — BP 128/76 | HR 110 | Wt 231.4 lb

## 2019-12-12 DIAGNOSIS — O24410 Gestational diabetes mellitus in pregnancy, diet controlled: Secondary | ICD-10-CM

## 2019-12-12 DIAGNOSIS — O0990 Supervision of high risk pregnancy, unspecified, unspecified trimester: Secondary | ICD-10-CM

## 2019-12-12 DIAGNOSIS — Z98891 History of uterine scar from previous surgery: Secondary | ICD-10-CM

## 2019-12-12 DIAGNOSIS — O99210 Obesity complicating pregnancy, unspecified trimester: Secondary | ICD-10-CM

## 2019-12-12 MED ORDER — FAMOTIDINE 20 MG PO TABS: 20.0000 mg | ORAL_TABLET | Freq: Two times a day (BID) | ORAL | 5 refills | Status: DC

## 2019-12-12 NOTE — Progress Notes (Signed)
Pt states having a lot of heart burn.

## 2019-12-12 NOTE — Progress Notes (Signed)
   Subjective:  Patty Smith is a 29 y.o. G2P1001 at [redacted]w[redacted]d being seen today for ongoing prenatal care.  She is currently monitored for the following issues for this high-risk pregnancy and has TOBACCO USER; DEPRESSION; BACK PAIN, LUMBAR, CHRONIC; Supervision of high risk pregnancy, antepartum; Obesity in pregnancy; Anxiety; History of C-section; Asymptomatic bacteriuria; Alpha thalassemia silent carrier; Gestational diabetes; and [redacted] weeks gestation of pregnancy on their problem list.  Patient reports some anxiety around size of baby.  Contractions: Irritability. Vag. Bleeding: None.  Movement: Present. Denies leaking of fluid.   The following portions of the patient's history were reviewed and updated as appropriate: allergies, current medications, past family history, past medical history, past social history, past surgical history and problem list. Problem list updated.  Objective:   Vitals:   12/12/19 1116  BP: 128/76  Pulse: (!) 110  Weight: 231 lb 6.4 oz (105 kg)    Fetal Status: Fetal Heart Rate (bpm): 161   Movement: Present     General:  Alert, oriented and cooperative. Patient is in no acute distress.  Skin: Skin is warm and dry. No rash noted.   Cardiovascular: Normal heart rate noted  Respiratory: Normal respiratory effort, no problems with respiration noted  Abdomen: Soft, gravid, appropriate for gestational age. Pain/Pressure: Present     Pelvic: Vag. Bleeding: None     Cervical exam deferred        Extremities: Normal range of motion.  Edema: None  Mental Status: Normal mood and affect. Normal behavior. Normal judgment and thought content.   Urinalysis:      Assessment and Plan:  Pregnancy: G2P1001 at [redacted]w[redacted]d  1. Supervision of high risk pregnancy, antepartum BP and FHR normal Worsening heartburn, sent pepcid Discussed contraception, would like OCP's, discussed low risk of decreased milk supply  2. Obesity in pregnancy   3. History of C-section Desires TOLAC,  consent signed 10/29/2019 Still waiting on op note from [place], ROI sent two weeks prior  4. Diet controlled gestational diabetes mellitus (GDM) in third trimester Sugars 91% at goal Growth scan two days prior EFW 70% 2739g, normal AFI Reassured about size Would like IOL at [redacted]w[redacted]d, schedule at next visit  Preterm labor symptoms and general obstetric precautions including but not limited to vaginal bleeding, contractions, leaking of fluid and fetal movement were reviewed in detail with the patient. Please refer to After Visit Summary for other counseling recommendations.  Return in 1 week (on 12/19/2019) for [facility], ob visit.   Venora Maples, MD

## 2019-12-12 NOTE — Patient Instructions (Signed)
 Third Trimester of Pregnancy The third trimester is from week 28 through week 40 (months 7 through 9). The third trimester is a time when the unborn baby (fetus) is growing rapidly. At the end of the ninth month, the fetus is about 20 inches in length and weighs 6-10 pounds. Body changes during your third trimester Your body will continue to go through many changes during pregnancy. The changes vary from woman to woman. During the third trimester:  Your weight will continue to increase. You can expect to gain 25-35 pounds (11-16 kg) by the end of the pregnancy.  You may begin to get stretch marks on your hips, abdomen, and breasts.  You may urinate more often because the fetus is moving lower into your pelvis and pressing on your bladder.  You may develop or continue to have heartburn. This is caused by increased hormones that slow down muscles in the digestive tract.  You may develop or continue to have constipation because increased hormones slow digestion and cause the muscles that push waste through your intestines to relax.  You may develop hemorrhoids. These are swollen veins (varicose veins) in the rectum that can itch or be painful.  You may develop swollen, bulging veins (varicose veins) in your legs.  You may have increased body aches in the pelvis, back, or thighs. This is due to weight gain and increased hormones that are relaxing your joints.  You may have changes in your hair. These can include thickening of your hair, rapid growth, and changes in texture. Some women also have hair loss during or after pregnancy, or hair that feels dry or thin. Your hair will most likely return to normal after your baby is born.  Your breasts will continue to grow and they will continue to become tender. A yellow fluid (colostrum) may leak from your breasts. This is the first milk you are producing for your baby.  Your belly button may stick out.  You may notice more swelling in your  hands, face, or ankles.  You may have increased tingling or numbness in your hands, arms, and legs. The skin on your belly may also feel numb.  You may feel short of breath because of your expanding uterus.  You may have more problems sleeping. This can be caused by the size of your belly, increased need to urinate, and an increase in your body's metabolism.  You may notice the fetus "dropping," or moving lower in your abdomen (lightening).  You may have increased vaginal discharge.  You may notice your joints feel loose and you may have pain around your pelvic bone. What to expect at prenatal visits You will have prenatal exams every 2 weeks until week 36. Then you will have weekly prenatal exams. During a routine prenatal visit:  You will be weighed to make sure you and the baby are growing normally.  Your blood pressure will be taken.  Your abdomen will be measured to track your baby's growth.  The fetal heartbeat will be listened to.  Any test results from the previous visit will be discussed.  You may have a cervical check near your due date to see if your cervix has softened or thinned (effaced).  You will be tested for Group B streptococcus. This happens between 35 and 37 weeks. Your health care provider may ask you:  What your birth plan is.  How you are feeling.  If you are feeling the baby move.  If you have had any   abnormal symptoms, such as leaking fluid, bleeding, severe headaches, or abdominal cramping.  If you are using any tobacco products, including cigarettes, chewing tobacco, and electronic cigarettes.  If you have any questions. Other tests or screenings that may be performed during your third trimester include:  Blood tests that check for low iron levels (anemia).  Fetal testing to check the health, activity level, and growth of the fetus. Testing is done if you have certain medical conditions or if there are problems during the  pregnancy.  Nonstress test (NST). This test checks the health of your baby to make sure there are no signs of problems, such as the baby not getting enough oxygen. During this test, a belt is placed around your belly. The baby is made to move, and its heart rate is monitored during movement. What is false labor? False labor is a condition in which you feel small, irregular tightenings of the muscles in the womb (contractions) that usually go away with rest, changing position, or drinking water. These are called Braxton Hicks contractions. Contractions may last for hours, days, or even weeks before true labor sets in. If contractions come at regular intervals, become more frequent, increase in intensity, or become painful, you should see your health care provider. What are the signs of labor?  Abdominal cramps.  Regular contractions that start at 10 minutes apart and become stronger and more frequent with time.  Contractions that start on the top of the uterus and spread down to the lower abdomen and back.  Increased pelvic pressure and dull back pain.  A watery or bloody mucus discharge that comes from the vagina.  Leaking of amniotic fluid. This is also known as your "water breaking." It could be a slow trickle or a gush. Let your health care provider know if it has a color or strange odor. If you have any of these signs, call your health care provider right away, even if it is before your due date. Follow these instructions at home: Medicines  Follow your health care provider's instructions regarding medicine use. Specific medicines may be either safe or unsafe to take during pregnancy.  Take a prenatal vitamin that contains at least 600 micrograms (mcg) of folic acid.  If you develop constipation, try taking a stool softener if your health care provider approves. Eating and drinking   Eat a balanced diet that includes fresh fruits and vegetables, whole grains, good sources of protein  such as meat, eggs, or tofu, and low-fat dairy. Your health care provider will help you determine the amount of weight gain that is right for you.  Avoid raw meat and uncooked cheese. These carry germs that can cause birth defects in the baby.  If you have low calcium intake from food, talk to your health care provider about whether you should take a daily calcium supplement.  Eat four or five small meals rather than three large meals a day.  Limit foods that are high in fat and processed sugars, such as fried and sweet foods.  To prevent constipation: ? Drink enough fluid to keep your urine clear or pale yellow. ? Eat foods that are high in fiber, such as fresh fruits and vegetables, whole grains, and beans. Activity  Exercise only as directed by your health care provider. Most women can continue their usual exercise routine during pregnancy. Try to exercise for 30 minutes at least 5 days a week. Stop exercising if you experience uterine contractions.  Avoid heavy lifting.    Do not exercise in extreme heat or humidity, or at high altitudes.  Wear low-heel, comfortable shoes.  Practice good posture.  You may continue to have sex unless your health care provider tells you otherwise. Relieving pain and discomfort  Take frequent breaks and rest with your legs elevated if you have leg cramps or low back pain.  Take warm sitz baths to soothe any pain or discomfort caused by hemorrhoids. Use hemorrhoid cream if your health care provider approves.  Wear a good support bra to prevent discomfort from breast tenderness.  If you develop varicose veins: ? Wear support pantyhose or compression stockings as told by your healthcare provider. ? Elevate your feet for 15 minutes, 3-4 times a day. Prenatal care  Write down your questions. Take them to your prenatal visits.  Keep all your prenatal visits as told by your health care provider. This is important. Safety  Wear your seat belt at  all times when driving.  Make a list of emergency phone numbers, including numbers for family, friends, the hospital, and police and fire departments. General instructions  Avoid cat litter boxes and soil used by cats. These carry germs that can cause birth defects in the baby. If you have a cat, ask someone to clean the litter box for you.  Do not travel far distances unless it is absolutely necessary and only with the approval of your health care provider.  Do not use hot tubs, steam rooms, or saunas.  Do not drink alcohol.  Do not use any products that contain nicotine or tobacco, such as cigarettes and e-cigarettes. If you need help quitting, ask your health care provider.  Do not use any medicinal herbs or unprescribed drugs. These chemicals affect the formation and growth of the baby.  Do not douche or use tampons or scented sanitary pads.  Do not cross your legs for long periods of time.  To prepare for the arrival of your baby: ? Take prenatal classes to understand, practice, and ask questions about labor and delivery. ? Make a trial run to the hospital. ? Visit the hospital and tour the maternity area. ? Arrange for maternity or paternity leave through employers. ? Arrange for family and friends to take care of pets while you are in the hospital. ? Purchase a rear-facing car seat and make sure you know how to install it in your car. ? Pack your hospital bag. ? Prepare the baby's nursery. Make sure to remove all pillows and stuffed animals from the baby's crib to prevent suffocation.  Visit your dentist if you have not gone during your pregnancy. Use a soft toothbrush to brush your teeth and be gentle when you floss. Contact a health care provider if:  You are unsure if you are in labor or if your water has broken.  You become dizzy.  You have mild pelvic cramps, pelvic pressure, or nagging pain in your abdominal area.  You have lower back pain.  You have persistent  nausea, vomiting, or diarrhea.  You have an unusual or bad smelling vaginal discharge.  You have pain when you urinate. Get help right away if:  Your water breaks before 37 weeks.  You have regular contractions less than 5 minutes apart before 37 weeks.  You have a fever.  You are leaking fluid from your vagina.  You have spotting or bleeding from your vagina.  You have severe abdominal pain or cramping.  You have rapid weight loss or weight gain.  You   have shortness of breath with chest pain.  You notice sudden or extreme swelling of your face, hands, ankles, feet, or legs.  Your baby makes fewer than 10 movements in 2 hours.  You have severe headaches that do not go away when you take medicine.  You have vision changes. Summary  The third trimester is from week 28 through week 40, months 7 through 9. The third trimester is a time when the unborn baby (fetus) is growing rapidly.  During the third trimester, your discomfort may increase as you and your baby continue to gain weight. You may have abdominal, leg, and back pain, sleeping problems, and an increased need to urinate.  During the third trimester your breasts will keep growing and they will continue to become tender. A yellow fluid (colostrum) may leak from your breasts. This is the first milk you are producing for your baby.  False labor is a condition in which you feel small, irregular tightenings of the muscles in the womb (contractions) that eventually go away. These are called Braxton Hicks contractions. Contractions may last for hours, days, or even weeks before true labor sets in.  Signs of labor can include: abdominal cramps; regular contractions that start at 10 minutes apart and become stronger and more frequent with time; watery or bloody mucus discharge that comes from the vagina; increased pelvic pressure and dull back pain; and leaking of amniotic fluid. This information is not intended to replace advice  given to you by your health care provider. Make sure you discuss any questions you have with your health care provider. Document Revised: 04/11/2018 Document Reviewed: 01/25/2016 Else[employer] Patient Education  2020 [employer]   Contraception Choices Contraception, also called birth control, refers to methods or devices that prevent pregnancy. Hormonal methods Contraceptive implant  A contraceptive implant is a thin, plastic tube that contains a hormone. It is inserted into the upper part of the arm. It can remain in place for up to 3 years. Progestin-only injections Progestin-only injections are injections of progestin, a synthetic form of the hormone progesterone. They are given every 3 months by a health care provider. Birth control pills  Birth control pills are pills that contain hormones that prevent pregnancy. They must be taken once a day, preferably at the same time each day. Birth control patch  The birth control patch contains hormones that prevent pregnancy. It is placed on the skin and must be changed once a week for three weeks and removed on the fourth week. A prescription is needed to use this method of contraception. Vaginal ring  A vaginal ring contains hormones that prevent pregnancy. It is placed in the vagina for three weeks and removed on the fourth week. After that, the process is repeated with a new ring. A prescription is needed to use this method of contraception. Emergency contraceptive Emergency contraceptives prevent pregnancy after unprotected sex. They come in pill form and can be taken up to 5 days after sex. They work best the sooner they are taken after having sex. Most emergency contraceptives are available without a prescription. This method should not be used as your only form of birth control. Barrier methods Female condom  A female condom is a thin sheath that is worn over the penis during sex. Condoms keep sperm from going inside a woman's body. They can  be used with a spermicide to increase their effectiveness. They should be disposed after a single use. Female condom  A female condom is a soft,   loose-fitting sheath that is put into the vagina before sex. The condom keeps sperm from going inside a woman's body. They should be disposed after a single use. Diaphragm  A diaphragm is a soft, dome-shaped barrier. It is inserted into the vagina before sex, along with a spermicide. The diaphragm blocks sperm from entering the uterus, and the spermicide kills sperm. A diaphragm should be left in the vagina for 6-8 hours after sex and removed within 24 hours. A diaphragm is prescribed and fitted by a health care provider. A diaphragm should be replaced every 1-2 years, after giving birth, after gaining more than 15 lb (6.8 kg), and after pelvic surgery. Cervical cap  A cervical cap is a round, soft latex or plastic cup that fits over the cervix. It is inserted into the vagina before sex, along with spermicide. It blocks sperm from entering the uterus. The cap should be left in place for 6-8 hours after sex and removed within 48 hours. A cervical cap must be prescribed and fitted by a health care provider. It should be replaced every 2 years. Sponge  A sponge is a soft, circular piece of polyurethane foam with spermicide on it. The sponge helps block sperm from entering the uterus, and the spermicide kills sperm. To use it, you make it wet and then insert it into the vagina. It should be inserted before sex, left in for at least 6 hours after sex, and removed and thrown away within 30 hours. Spermicides Spermicides are chemicals that kill or block sperm from entering the cervix and uterus. They can come as a cream, jelly, suppository, foam, or tablet. A spermicide should be inserted into the vagina with an applicator at least 10-15 minutes before sex to allow time for it to work. The process must be repeated every time you have sex. Spermicides do not require  a prescription. Intrauterine contraception Intrauterine device (IUD) An IUD is a T-shaped device that is put in a woman's uterus. There are two types:  Hormone IUD.This type contains progestin, a synthetic form of the hormone progesterone. This type can stay in place for 3-5 years.  Copper IUD.This type is wrapped in copper wire. It can stay in place for 10 years.  Permanent methods of contraception Female tubal ligation In this method, a woman's fallopian tubes are sealed, tied, or blocked during surgery to prevent eggs from traveling to the uterus. Hysteroscopic sterilization In this method, a small, flexible insert is placed into each fallopian tube. The inserts cause scar tissue to form in the fallopian tubes and block them, so sperm cannot reach an egg. The procedure takes about 3 months to be effective. Another form of birth control must be used during those 3 months. Female sterilization This is a procedure to tie off the tubes that carry sperm (vasectomy). After the procedure, the man can still ejaculate fluid (semen). Natural planning methods Natural family planning In this method, a couple does not have sex on days when the woman could become pregnant. Calendar method This means keeping track of the length of each menstrual cycle, identifying the days when pregnancy can happen, and not having sex on those days. Ovulation method In this method, a couple avoids sex during ovulation. Symptothermal method This method involves not having sex during ovulation. The woman typically checks for ovulation by watching changes in her temperature and in the consistency of cervical mucus. Post-ovulation method In this method, a couple waits to have sex until after ovulation. Summary    Contraception, also called birth control, means methods or devices that prevent pregnancy.  Hormonal methods of contraception include implants, injections, pills, patches, vaginal rings, and emergency  contraceptives.  Barrier methods of contraception can include female condoms, female condoms, diaphragms, cervical caps, sponges, and spermicides.  There are two types of IUDs (intrauterine devices). An IUD can be put in a woman's uterus to prevent pregnancy for 3-5 years.  Permanent sterilization can be done through a procedure for males, females, or both.  Natural family planning methods involve not having sex on days when the woman could become pregnant. This information is not intended to replace advice given to you by your health care provider. Make sure you discuss any questions you have with your health care provider. Document Revised: 12/21/2016 Document Reviewed: 01/22/2016 Else[employer] Patient Education  2020 [employer]   Breastfeeding  Choosing to breastfeed is one of the best decisions you can make for yourself and your baby. A change in hormones during pregnancy causes your breasts to make breast milk in your milk-producing glands. Hormones prevent breast milk from being released before your baby is born. They also prompt milk flow after birth. Once breastfeeding has begun, thoughts of your baby, as well as his or her sucking or crying, can stimulate the release of milk from your milk-producing glands. Benefits of breastfeeding Research shows that breastfeeding offers many health benefits for infants and mothers. It also offers a cost-free and convenient way to feed your baby. For your baby  Your first milk (colostrum) helps your baby's digestive system to function better.  Special cells in your milk (antibodies) help your baby to fight off infections.  Breastfed babies are less likely to develop asthma, allergies, obesity, or type 2 diabetes. They are also at lower risk for sudden infant death syndrome (SIDS).  Nutrients in breast milk are better able to meet your baby's needs compared to infant formula.  Breast milk improves your baby's brain development. For  you  Breastfeeding helps to create a very special bond between you and your baby.  Breastfeeding is convenient. Breast milk costs nothing and is always available at the correct temperature.  Breastfeeding helps to burn calories. It helps you to lose the weight that you gained during pregnancy.  Breastfeeding makes your uterus return faster to its size before pregnancy. It also slows bleeding (lochia) after you give birth.  Breastfeeding helps to lower your risk of developing type 2 diabetes, osteoporosis, rheumatoid arthritis, cardiovascular disease, and breast, ovarian, uterine, and endometrial cancer later in life. Breastfeeding basics Starting breastfeeding  Find a comfortable place to sit or lie down, with your neck and back well-supported.  Place a pillow or a rolled-up blanket under your baby to bring him or her to the level of your breast (if you are seated). Nursing pillows are specially designed to help support your arms and your baby while you breastfeed.  Make sure that your baby's tummy (abdomen) is facing your abdomen.  Gently massage your breast. With your fingertips, massage from the outer edges of your breast inward toward the nipple. This encourages milk flow. If your milk flows slowly, you may need to continue this action during the feeding.  Support your breast with 4 fingers underneath and your thumb above your nipple (make the letter "C" with your hand). Make sure your fingers are well away from your nipple and your baby's mouth.  Stroke your baby's lips gently with your finger or nipple.  When your baby's mouth is open   wide enough, quickly bring your baby to your breast, placing your entire nipple and as much of the areola as possible into your baby's mouth. The areola is the colored area around your nipple. ? More areola should be visible above your baby's upper lip than below the lower lip. ? Your baby's lips should be opened and extended outward (flanged) to  ensure an adequate, comfortable latch. ? Your baby's tongue should be between his or her lower gum and your breast.  Make sure that your baby's mouth is correctly positioned around your nipple (latched). Your baby's lips should create a seal on your breast and be turned out (everted).  It is common for your baby to suck about 2-3 minutes in order to start the flow of breast milk. Latching Teaching your baby how to latch onto your breast properly is very important. An improper latch can cause nipple pain, decreased milk supply, and poor weight gain in your baby. Also, if your baby is not latched onto your nipple properly, he or she may swallow some air during feeding. This can make your baby fussy. Burping your baby when you switch breasts during the feeding can help to get rid of the air. However, teaching your baby to latch on properly is still the best way to prevent fussiness from swallowing air while breastfeeding. Signs that your baby has successfully latched onto your nipple  Silent tugging or silent sucking, without causing you pain. Infant's lips should be extended outward (flanged).  Swallowing heard between every 3-4 sucks once your milk has started to flow (after your let-down milk reflex occurs).  Muscle movement above and in front of his or her ears while sucking. Signs that your baby has not successfully latched onto your nipple  Sucking sounds or smacking sounds from your baby while breastfeeding.  Nipple pain. If you think your baby has not latched on correctly, slip your finger into the corner of your baby's mouth to break the suction and place it between your baby's gums. Attempt to start breastfeeding again. Signs of successful breastfeeding Signs from your baby  Your baby will gradually decrease the number of sucks or will completely stop sucking.  Your baby will fall asleep.  Your baby's body will relax.  Your baby will retain a small amount of milk in his or her  mouth.  Your baby will let go of your breast by himself or herself. Signs from you  Breasts that have increased in firmness, weight, and size 1-3 hours after feeding.  Breasts that are softer immediately after breastfeeding.  Increased milk volume, as well as a change in milk consistency and color by the fifth day of breastfeeding.  Nipples that are not sore, cracked, or bleeding. Signs that your baby is getting enough milk  Wetting at least 1-2 diapers during the first 24 hours after birth.  Wetting at least 5-6 diapers every 24 hours for the first week after birth. The urine should be clear or pale yellow by the age of 5 days.  Wetting 6-8 diapers every 24 hours as your baby continues to grow and develop.  At least 3 stools in a 24-hour period by the age of 5 days. The stool should be soft and yellow.  At least 3 stools in a 24-hour period by the age of 7 days. The stool should be seedy and yellow.  No loss of weight greater than 10% of birth weight during the first 3 days of life.  Average weight gain   of 4-7 oz (113-198 g) per week after the age of 4 days.  Consistent daily weight gain by the age of 5 days, without weight loss after the age of 2 weeks. After a feeding, your baby may spit up a small amount of milk. This is normal. Breastfeeding frequency and duration Frequent feeding will help you make more milk and can prevent sore nipples and extremely full breasts (breast engorgement). Breastfeed when you feel the need to reduce the fullness of your breasts or when your baby shows signs of hunger. This is called "breastfeeding on demand." Signs that your baby is hungry include:  Increased alertness, activity, or restlessness.  Movement of the head from side to side.  Opening of the mouth when the corner of the mouth or cheek is stroked (rooting).  Increased sucking sounds, smacking lips, cooing, sighing, or squeaking.  Hand-to-mouth movements and sucking on fingers or  hands.  Fussing or crying. Avoid introducing a pacifier to your baby in the first 4-6 weeks after your baby is born. After this time, you may choose to use a pacifier. Research has shown that pacifier use during the first year of a baby's life decreases the risk of sudden infant death syndrome (SIDS). Allow your baby to feed on each breast as long as he or she wants. When your baby unlatches or falls asleep while feeding from the first breast, offer the second breast. Because newborns are often sleepy in the first few weeks of life, you may need to awaken your baby to get him or her to feed. Breastfeeding times will vary from baby to baby. However, the following rules can serve as a guide to help you make sure that your baby is properly fed:  Newborns (babies 4 weeks of age or younger) may breastfeed every 1-3 hours.  Newborns should not go without breastfeeding for longer than 3 hours during the day or 5 hours during the night.  You should breastfeed your baby a minimum of 8 times in a 24-hour period. Breast milk pumping     Pumping and storing breast milk allows you to make sure that your baby is exclusively fed your breast milk, even at times when you are unable to breastfeed. This is especially important if you go back to work while you are still breastfeeding, or if you are not able to be present during feedings. Your lactation consultant can help you find a method of pumping that works best for you and give you guidelines about how long it is safe to store breast milk. Caring for your breasts while you breastfeed Nipples can become dry, cracked, and sore while breastfeeding. The following recommendations can help keep your breasts moisturized and healthy:  Avoid using soap on your nipples.  Wear a supportive bra designed especially for nursing. Avoid wearing underwire-style bras or extremely tight bras (sports bras).  Air-dry your nipples for 3-4 minutes after each feeding.  Use only  cotton bra pads to absorb leaked breast milk. Leaking of breast milk between feedings is normal.  Use lanolin on your nipples after breastfeeding. Lanolin helps to maintain your skin's normal moisture barrier. Pure lanolin is not harmful (not toxic) to your baby. You may also hand express a few drops of breast milk and gently massage that milk into your nipples and allow the milk to air-dry. In the first few weeks after giving birth, some women experience breast engorgement. Engorgement can make your breasts feel heavy, warm, and tender to the touch. Engorgement   peaks within 3-5 days after you give birth. The following recommendations can help to ease engorgement:  Completely empty your breasts while breastfeeding or pumping. You may want to start by applying warm, moist heat (in the shower or with warm, water-soaked hand towels) just before feeding or pumping. This increases circulation and helps the milk flow. If your baby does not completely empty your breasts while breastfeeding, pump any extra milk after he or she is finished.  Apply ice packs to your breasts immediately after breastfeeding or pumping, unless this is too uncomfortable for you. To do this: ? Put ice in a plastic bag. ? Place a towel between your skin and the bag. ? Leave the ice on for 20 minutes, 2-3 times a day.  Make sure that your baby is latched on and positioned properly while breastfeeding. If engorgement persists after 48 hours of following these recommendations, contact your health care provider or a lactation consultant. Overall health care recommendations while breastfeeding  Eat 3 healthy meals and 3 snacks every day. Well-nourished mothers who are breastfeeding need an additional 450-500 calories a day. You can meet this requirement by increasing the amount of a balanced diet that you eat.  Drink enough water to keep your urine pale yellow or clear.  Rest often, relax, and continue to take your prenatal vitamins  to prevent fatigue, stress, and low vitamin and mineral levels in your body (nutrient deficiencies).  Do not use any products that contain nicotine or tobacco, such as cigarettes and e-cigarettes. Your baby may be harmed by chemicals from cigarettes that pass into breast milk and exposure to secondhand smoke. If you need help quitting, ask your health care provider.  Avoid alcohol.  Do not use illegal drugs or marijuana.  Talk with your health care provider before taking any medicines. These include over-the-counter and prescription medicines as well as vitamins and herbal supplements. Some medicines that may be harmful to your baby can pass through breast milk.  It is possible to become pregnant while breastfeeding. If birth control is desired, ask your health care provider about options that will be safe while breastfeeding your baby. Where to find more information: [employer]: www.llli.org Contact a health care provider if:  You feel like you want to stop breastfeeding or have become frustrated with breastfeeding.  Your nipples are cracked or bleeding.  Your breasts are red, tender, or warm.  You have: ? Painful breasts or nipples. ? A swollen area on either breast. ? A fever or chills. ? Nausea or vomiting. ? Drainage other than breast milk from your nipples.  Your breasts do not become full before feedings by the fifth day after you give birth.  You feel sad and depressed.  Your baby is: ? Too sleepy to eat well. ? Having trouble sleeping. ? More than 1 week old and wetting fewer than 6 diapers in a 24-hour period. ? Not gaining weight by 5 days of age.  Your baby has fewer than 3 stools in a 24-hour period.  Your baby's skin or the white parts of his or her eyes become yellow. Get help right away if:  Your baby is overly tired (lethargic) and does not want to wake up and feed.  Your baby develops an unexplained fever. Summary  Breastfeeding  offers many health benefits for infant and mothers.  Try to breastfeed your infant when he or she shows early signs of hunger.  Gently tickle or stroke your baby's lips with   your finger or nipple to allow the baby to open his or her mouth. Bring the baby to your breast. Make sure that much of the areola is in your baby's mouth. Offer one side and burp the baby before you offer the other side.  Talk with your health care provider or lactation consultant if you have questions or you face problems as you breastfeed. This information is not intended to replace advice given to you by your health care provider. Make sure you discuss any questions you have with your health care provider. Document Revised: 03/15/2017 Document Reviewed: 01/21/2016 Elsevier Patient Education  2020 Elsevier Inc.  

## 2019-12-18 ENCOUNTER — Encounter: Payer: Self-pay | Admitting: *Deleted

## 2019-12-18 ENCOUNTER — Telehealth: Payer: Self-pay

## 2019-12-18 NOTE — Telephone Encounter (Signed)
Called [facility] to request OP note for 2014 c-section. 2 prior release of information sent from our office. Staff member took fax number and states they will attempt to find this record.

## 2019-12-22 ENCOUNTER — Ambulatory Visit (INDEPENDENT_AMBULATORY_CARE_PROVIDER_SITE_OTHER): Payer: Medicaid Other | Admitting: Obstetrics and Gynecology

## 2019-12-22 ENCOUNTER — Other Ambulatory Visit (HOSPITAL_COMMUNITY)
Admission: RE | Admit: 2019-12-22 | Discharge: 2019-12-22 | Disposition: A | Discharge status: Home / Self Care | Payer: Medicaid Other | Source: Ambulatory Visit | Attending: Obstetrics and Gynecology | Admitting: Obstetrics and Gynecology

## 2019-12-22 ENCOUNTER — Other Ambulatory Visit: Payer: Self-pay

## 2019-12-22 VITALS — BP 123/79 | HR 108 | Wt 232.5 lb

## 2019-12-22 DIAGNOSIS — Z98891 History of uterine scar from previous surgery: Secondary | ICD-10-CM

## 2019-12-22 DIAGNOSIS — O0990 Supervision of high risk pregnancy, unspecified, unspecified trimester: Secondary | ICD-10-CM | POA: Insufficient documentation

## 2019-12-22 DIAGNOSIS — O24410 Gestational diabetes mellitus in pregnancy, diet controlled: Secondary | ICD-10-CM

## 2019-12-22 DIAGNOSIS — O34219 Maternal care for unspecified type scar from previous cesarean delivery: Secondary | ICD-10-CM

## 2019-12-22 DIAGNOSIS — Z3A36 36 weeks gestation of pregnancy: Secondary | ICD-10-CM

## 2019-12-22 NOTE — Patient Instructions (Signed)
Braxton Hicks Contractions °Contractions of the uterus can occur throughout pregnancy, but they are not always a sign that you are in labor. You may have practice contractions called Braxton Hicks contractions. These false labor contractions are sometimes confused with true labor. °What are Braxton Hicks contractions? °Braxton Hicks contractions are tightening movements that occur in the muscles of the uterus before labor. Unlike true labor contractions, these contractions do not result in opening (dilation) and thinning of the cervix. Toward the end of pregnancy (32-34 weeks), Braxton Hicks contractions can happen more often and may become stronger. These contractions are sometimes difficult to tell apart from true labor because they can be very uncomfortable. You should not feel embarrassed if you go to the hospital with false labor. °Sometimes, the only way to tell if you are in true labor is for your health care provider to look for changes in the cervix. The health care provider will do a physical exam and may monitor your contractions. If you are not in true labor, the exam should show that your cervix is not dilating and your water has not broken. °If there are no other health problems associated with your pregnancy, it is completely safe for you to be sent home with false labor. You may continue to have Braxton Hicks contractions until you go into true labor. °How to tell the difference between true labor and false labor °True labor °· Contractions last 30-70 seconds. °· Contractions become very regular. °· Discomfort is usually felt in the top of the uterus, and it spreads to the lower abdomen and low back. °· Contractions do not go away with walking. °· Contractions usually become more intense and increase in frequency. °· The cervix dilates and gets thinner. °False labor °· Contractions are usually shorter and not as strong as true labor contractions. °· Contractions are usually irregular. °· Contractions  are often felt in the front of the lower abdomen and in the groin. °· Contractions may go away when you walk around or change positions while lying down. °· Contractions get weaker and are shorter-lasting as time goes on. °· The cervix usually does not dilate or become thin. °Follow these instructions at home: ° °· Take over-the-counter and prescription medicines only as told by your health care provider. °· Keep up with your usual exercises and follow other instructions from your health care provider. °· Eat and drink lightly if you think you are going into labor. °· If Braxton Hicks contractions are making you uncomfortable: °? Change your position from lying down or resting to walking, or change from walking to resting. °? Sit and rest in a tub of warm water. °? Drink enough fluid to keep your urine pale yellow. Dehydration may cause these contractions. °? Do slow and deep breathing several times an hour. °· Keep all follow-up prenatal visits as told by your health care provider. This is important. °Contact a health care provider if: °· You have a fever. °· You have continuous pain in your abdomen. °Get help right away if: °· Your contractions become stronger, more regular, and closer together. °· You have fluid leaking or gushing from your vagina. °· You pass blood-tinged mucus (bloody show). °· You have bleeding from your vagina. °· You have low back pain that you never had before. °· You feel your baby’s head pushing down and causing pelvic pressure. °· Your baby is not moving inside you as much as it used to. °Summary °· Contractions that occur before labor are   called Braxton Hicks contractions, false labor, or practice contractions. °· Braxton Hicks contractions are usually shorter, weaker, farther apart, and less regular than true labor contractions. True labor contractions usually become progressively stronger and regular, and they become more frequent. °· Manage discomfort from Braxton Hicks contractions  by changing position, resting in a warm bath, drinking plenty of water, or practicing deep breathing. °This information is not intended to replace advice given to you by your health care provider. Make sure you discuss any questions you have with your health care provider. °Document Revised: 12/01/2016 Document Reviewed: 05/04/2016 °Elsevier Patient Education © 2020 Elsevier Inc. ° °

## 2019-12-22 NOTE — Progress Notes (Signed)
   HIGH-RISK PREGNANCY VISIT Patient name: Patty Smith MRN [id number]  Date of birth: 10-25-1990 Chief Complaint:   Routine Prenatal Visit  History of Present Illness:   Patty Smith is a 29 y.o. G71P1001 female at [redacted]w[redacted]d with an Estimated Date of Delivery: 01/15/20 being seen today for ongoing management of a high-risk pregnancy complicated by diabetes mellitus A1DM.  Today she reports back/hip preg discomforts; tired of being pregnant; maternity support belt not helping much.  FBS: all <95    2hr PPs: one 122, all others <120 Depression screen [facility] 2/9 11/26/2019 11/12/2019 10/29/2019 10/01/2019 09/02/2019  Decreased Interest 2 3 2 1 2   Down, Depressed, Hopeless 3 3 3 3 1   PHQ - 2 Score 5 6 5 4 3   Altered sleeping 2 2 2 1 1   Tired, decreased energy 3 3 3 2 2   Change in appetite 2 3 0 0 1  Feeling bad or failure about yourself  2 3 2 3  0  Trouble concentrating 1 1 0 1 2  Moving slowly or fidgety/restless 0 0 0 0 0  Suicidal thoughts 0 0 0 0 0  PHQ-9 Score 15 18 12 11 9   Some recent data might be hidden    Contractions: Irritability. Vag. Bleeding: None.  Movement: Present. denies leaking of fluid.  Review of Systems:   Pertinent items are noted in HPI Denies abnormal vaginal discharge w/ itching/odor/irritation, headaches, visual changes, shortness of breath, chest pain, abdominal pain, severe nausea/vomiting, or problems with urination or bowel movements unless otherwise stated above. Pertinent History Reviewed:  Reviewed past medical,surgical, social, obstetrical and family history.  Reviewed problem list, medications and allergies. Physical Assessment:   Vitals:   12/22/19 1002  BP: 123/79  Pulse: (!) 108  Weight: 105.5 kg  Body mass index is 38.69 kg/m.           Physical Examination:   General appearance: alert, well appearing, and in no distress  Mental status: alert, oriented to person, place, and time  Skin: warm & dry   Extremities: Edema: None    Cardiovascular: normal  heart rate noted  Respiratory: normal respiratory effort, no distress  Abdomen: gravid, soft, non-tender  Pelvic: Cervical exam deferred         Fetal Status: Fetal Heart Rate (bpm): 154 Fundal Height: 36 cm Movement: Present Presentation: Vertex  Fetal Surveillance Testing today: doppler    No results found for this or any previous visit (from the past 24 hour(s)).  Assessment & Plan:  High-risk pregnancy: G2P1001 at [redacted]w[redacted]d with an Estimated Date of Delivery: 01/15/20   1) GDMA1, stable; CBGs mostly within goal range; EFW 70th% @ 35wks; will schedule IOL for 01/08/20 at her next visit  2) Prev C/S, plans TOLAC, consent already signed   Meds: No orders of the defined types were placed in this encounter.   Labs/procedures today: GBS/GC/chlam  Treatment Plan:  Will plan for 39wk IOL- pt requesting earlier>issue reviewed  Reviewed: Term labor symptoms and general obstetric precautions including but not limited to vaginal bleeding, contractions, leaking of fluid and fetal movement were reviewed in detail with the patient.  All questions were answered. Didn't ask about home bp cuff.  Check bp weekly, let know if >140/90.   Follow-up: Return in about 1 week (around 12/29/2019) for [facility], in person.   No future appointments.  Orders Placed This Encounter  Procedures  . Culture, beta strep (group b only)   [facility] 12/22/2019 10:48 AM

## 2019-12-24 LAB — GC/CHLAMYDIA PROBE AMP (~~LOC~~) NOT AT ARMC
Chlamydia: NEGATIVE
Comment: NEGATIVE
Comment: NORMAL
Neisseria Gonorrhea: NEGATIVE

## 2019-12-26 LAB — CULTURE, BETA STREP (GROUP B ONLY): Strep Gp B Culture: NEGATIVE

## 2019-12-29 ENCOUNTER — Other Ambulatory Visit: Payer: Self-pay

## 2019-12-29 ENCOUNTER — Ambulatory Visit (INDEPENDENT_AMBULATORY_CARE_PROVIDER_SITE_OTHER): Payer: Medicaid Other | Admitting: Obstetrics & Gynecology

## 2019-12-29 VITALS — BP 126/85 | HR 102 | Wt 236.0 lb

## 2019-12-29 DIAGNOSIS — Z98891 History of uterine scar from previous surgery: Secondary | ICD-10-CM

## 2019-12-29 DIAGNOSIS — Z3A37 37 weeks gestation of pregnancy: Secondary | ICD-10-CM

## 2019-12-29 DIAGNOSIS — O0990 Supervision of high risk pregnancy, unspecified, unspecified trimester: Secondary | ICD-10-CM

## 2019-12-29 DIAGNOSIS — O24410 Gestational diabetes mellitus in pregnancy, diet controlled: Secondary | ICD-10-CM

## 2019-12-29 LAB — POCT URINALYSIS DIP (DEVICE)
Bilirubin Urine: NEGATIVE
Glucose, UA: NEGATIVE mg/dL
Hgb urine dipstick: NEGATIVE
Ketones, ur: NEGATIVE mg/dL
Leukocytes,Ua: NEGATIVE
Nitrite: NEGATIVE
Protein, ur: NEGATIVE mg/dL
Specific Gravity, Urine: >=1.03 (ref 1.005–1.030)
Urobilinogen, UA: 1 mg/dL (ref 0.0–1.0)
pH: 6.5 (ref 5.0–8.0)

## 2019-12-29 NOTE — Progress Notes (Signed)
   PRENATAL VISIT NOTE  Subjective:  Patty Smith is a 29 y.o. G2P1001 at [redacted]w[redacted]d being seen today for ongoing prenatal care.  She is currently monitored for the following issues for this high-risk pregnancy and has TOBACCO USER; DEPRESSION; BACK PAIN, LUMBAR, CHRONIC; Supervision of high risk pregnancy, antepartum; Obesity in pregnancy; Anxiety; History of C-section; Asymptomatic bacteriuria; Alpha thalassemia silent carrier; and Gestational diabetes on their problem list.  Patient reports feeling tired.  Contractions: Irritability. Vag. Bleeding: None.  Movement: Present. Denies leaking of fluid.   Didn't bring blood sugars with her.  She reports fasting BS are <94. 2 hour postprandials are <120.  She's had on 133 level.  The following portions of the patient's history were reviewed and updated as appropriate: allergies, current medications, past family history, past medical history, past social history, past surgical history and problem list.   Objective:   Vitals:   12/29/19 1458  BP: 126/85  Pulse: (!) 102  Weight: 236 lb (107 kg)    Fetal Status: Fetal Heart Rate (bpm): 160 Fundal Height: 38 cm Movement: Present     General:  Alert, oriented and cooperative. Patient is in no acute distress.  Skin: Skin is warm and dry. No rash noted.   Cardiovascular: Normal heart rate noted  Respiratory: Normal respiratory effort, no problems with respiration noted  Abdomen: Soft, gravid, appropriate for gestational age.  Pain/Pressure: Present     Pelvic: Cervical exam deferred        Extremities: Normal range of motion.  Edema: None  Mental Status: Normal mood and affect. Normal behavior. Normal judgment and thought content.   Assessment and Plan:  Pregnancy: G2P1001 at [redacted]w[redacted]d 1. History of C-section - desires repeat c section.  Will schedule.  Message sent for scheduling.  2. [redacted] weeks gestation of pregnancy  3. Supervision of high risk pregnancy, antepartum  4. Diet controlled gestational  diabetes mellitus (GDM) in third trimester - Encouraged pt to bring blood sugars with her.  Term labor symptoms and general obstetric precautions including but not limited to vaginal bleeding, contractions, leaking of fluid and fetal movement were reviewed in detail with the patient. Please refer to After Visit Summary for other counseling recommendations.   Return in about 1 week (around 01/05/2020) for Office Ob visit (MD only).  Future Appointments  Date Time Provider Department Center  01/08/2020  8:40 AM [facility] SCHED ROOM [facility] None    Jerene Bears, MD

## 2020-01-05 NOTE — Patient Instructions (Signed)
Celestial Barnfield  01/05/2020   Your procedure is scheduled on:  01/10/2020  Arrive at 0745 at Entrance C on [employer] at [facility]  and [employer]. You are invited to use the FREE valet parking or use the Visitor's parking deck.  Pick up the phone at the desk and dial [phone].  Call this number if you have problems the morning of surgery: [phone]  Remember:   Do not eat food:(After Midnight) Desps de medianoche.  Do not drink clear liquids: (After Midnight) Desps de medianoche.  Take these medicines the morning of surgery with A SIP OF WATER:  none   Do not wear jewelry, make-up or nail polish.  Do not wear lotions, powders, or perfumes. Do not wear deodorant.  Do not shave 48 hours prior to surgery.  Do not bring valuables to the hospital.  [facility] is not   responsible for any belongings or valuables brought to the hospital.  Contacts, dentures or bridgework may not be worn into surgery.  Leave suitcase in the car. After surgery it may be brought to your room.  For patients admitted to the hospital, checkout time is 11:00 AM the day of              discharge.      Please read over the following fact sheets that you were given:     Preparing for Surgery

## 2020-01-06 ENCOUNTER — Encounter (HOSPITAL_COMMUNITY): Payer: Self-pay

## 2020-01-06 ENCOUNTER — Encounter: Payer: Self-pay | Admitting: Obstetrics and Gynecology

## 2020-01-06 ENCOUNTER — Other Ambulatory Visit: Payer: Self-pay | Admitting: Obstetrics and Gynecology

## 2020-01-08 ENCOUNTER — Encounter (HOSPITAL_COMMUNITY)
Admission: RE | Admit: 2020-01-08 | Discharge: 2020-01-08 | Disposition: A | Discharge status: Home / Self Care | Payer: Medicaid Other | Source: Ambulatory Visit | Attending: Obstetrics and Gynecology | Admitting: Obstetrics and Gynecology

## 2020-01-08 ENCOUNTER — Encounter (HOSPITAL_COMMUNITY): Payer: Self-pay

## 2020-01-08 ENCOUNTER — Ambulatory Visit (INDEPENDENT_AMBULATORY_CARE_PROVIDER_SITE_OTHER): Payer: Medicaid Other | Admitting: Obstetrics & Gynecology

## 2020-01-08 ENCOUNTER — Other Ambulatory Visit (HOSPITAL_COMMUNITY)
Admission: RE | Admit: 2020-01-08 | Discharge: 2020-01-08 | Disposition: A | Discharge status: Home / Self Care | Payer: Medicaid Other | Source: Ambulatory Visit | Attending: Obstetrics and Gynecology | Admitting: Obstetrics and Gynecology

## 2020-01-08 ENCOUNTER — Encounter (HOSPITAL_COMMUNITY): Payer: Medicaid Other

## 2020-01-08 ENCOUNTER — Other Ambulatory Visit: Payer: Self-pay

## 2020-01-08 VITALS — BP 137/76 | HR 104 | Wt 239.9 lb

## 2020-01-08 DIAGNOSIS — Z98891 History of uterine scar from previous surgery: Secondary | ICD-10-CM

## 2020-01-08 DIAGNOSIS — F17200 Nicotine dependence, unspecified, uncomplicated: Secondary | ICD-10-CM

## 2020-01-08 DIAGNOSIS — Z20822 Contact with and (suspected) exposure to covid-19: Secondary | ICD-10-CM | POA: Insufficient documentation

## 2020-01-08 DIAGNOSIS — O0990 Supervision of high risk pregnancy, unspecified, unspecified trimester: Secondary | ICD-10-CM

## 2020-01-08 DIAGNOSIS — Z01812 Encounter for preprocedural laboratory examination: Secondary | ICD-10-CM | POA: Insufficient documentation

## 2020-01-08 DIAGNOSIS — O24410 Gestational diabetes mellitus in pregnancy, diet controlled: Secondary | ICD-10-CM

## 2020-01-08 HISTORY — DX: Gestational diabetes mellitus in pregnancy, unspecified control: O24.419

## 2020-01-08 HISTORY — DX: Family history of other specified conditions: Z84.89

## 2020-01-08 LAB — TYPE AND SCREEN
ABO/RH(D): O POS
Antibody Screen: NEGATIVE

## 2020-01-08 LAB — CBC
HCT: 35.7 % — ABNORMAL LOW (ref 36.0–46.0)
Hemoglobin: 11.7 g/dL — ABNORMAL LOW (ref 12.0–15.0)
MCH: 28.4 pg (ref 26.0–34.0)
MCHC: 32.8 g/dL (ref 30.0–36.0)
MCV: 86.7 fL (ref 80.0–100.0)
Platelets: 250 10*3/uL (ref 150–400)
RBC: 4.12 MIL/uL (ref 3.87–5.11)
RDW: 13.2 % (ref 11.5–15.5)
WBC: 9.4 10*3/uL (ref 4.0–10.5)
nRBC: 0 % (ref 0.0–0.2)

## 2020-01-08 LAB — SARS CORONAVIRUS 2 (TAT 6-24 HRS): SARS Coronavirus 2: NEGATIVE

## 2020-01-08 NOTE — Patient Instructions (Signed)
Cesarean Delivery Cesarean birth, or cesarean delivery, is the surgical delivery of a baby through an incision in the abdomen and the uterus. This may be referred to as a C-section. This procedure may be scheduled ahead of time, or it may be done in an emergency situation. Tell a health care provider about:  Any allergies you have.  All medicines you are taking, including vitamins, herbs, eye drops, creams, and over-the-counter medicines.  Any problems you or family members have had with anesthetic medicines.  Any blood disorders you have.  Any surgeries you have had.  Any medical conditions you have.  Whether you or any members of your family have a history of deep vein thrombosis (DVT) or pulmonary embolism (PE). What are the risks? Generally, this is a safe procedure. However, problems may occur, including:  Infection.  Bleeding.  Allergic reactions to medicines.  Damage to other structures or organs.  Blood clots.  Injury to your baby. What happens before the procedure? General instructions  Follow instructions from your health care provider about eating or drinking restrictions.  If you know that you are going to have a cesarean delivery, do not shave your pubic area. Shaving before the procedure may increase your risk of infection.  Plan to have someone take you home from the hospital.  Ask your health care provider what steps will be taken to prevent infection. These may include: ? Removing hair at the surgery site. ? Washing skin with a germ-killing soap. ? Taking antibiotic medicine.  Depending on the reason for your cesarean delivery, you may have a physical exam or additional testing, such as an ultrasound.  You may have your blood or urine tested. Questions for your health care provider  Ask your health care provider about: ? Changing or stopping your regular medicines. This is especially important if you are taking diabetes medicines or blood  thinners. ? Your pain management plan. This is especially important if you plan to breastfeed your baby. ? How long you will be in the hospital after the procedure. ? Any concerns you may have about receiving blood products, if you need them during the procedure. ? Cord blood banking, if you plan to collect your baby's umbilical cord blood.  You may also want to ask your health care provider: ? Whether you will be able to hold or breastfeed your baby while you are still in the operating room. ? Whether your baby can stay with you immediately after the procedure and during your recovery. ? Whether a family member or a person of your choice can go with you into the operating room and stay with you during the procedure, immediately after the procedure, and during your recovery. What happens during the procedure?   An IV will be inserted into one of your veins.  Fluid and medicines, such as antibiotics, will be given before the surgery.  Fetal monitors will be placed on your abdomen to check your baby's heart rate.  You may be given a special warming gown to wear to keep your temperature stable.  A catheter may be inserted into your bladder through your urethra. This drains your urine during the procedure.  You may be given one or more of the following: ? A medicine to numb the area (local anesthetic). ? A medicine to make you fall asleep (general anesthetic). ? A medicine (regional anesthetic) that is injected into your back or through a small thin tube placed in your back (spinal anesthetic or epidural anesthetic).   This numbs everything below the injection site and allows you to stay awake during your procedure. If this makes you feel nauseous, tell your health care provider. Medicines will be available to help reduce any nausea you may feel.  An incision will be made in your abdomen, and then in your uterus.  If you are awake during your procedure, you may feel tugging and pulling in  your abdomen, but you should not feel pain. If you feel pain, tell your health care provider immediately.  Your baby will be removed from your uterus. You may feel more pressure or pushing while this happens.  Immediately after birth, your baby will be dried and kept warm. You may be able to hold and breastfeed your baby.  The umbilical cord may be clamped and cut during this time. This usually occurs after waiting a period of 1-2 minutes after delivery.  Your placenta will be removed from your uterus.  Your incisions will be closed with stitches (sutures). Staples, skin glue, or adhesive strips may also be applied to the incision in your abdomen.  Bandages (dressings) may be placed over the incision in your abdomen. The procedure may vary among health care providers and hospitals. What happens after the procedure?  Your blood pressure, heart rate, breathing rate, and blood oxygen level will be monitored until you are discharged from the hospital.  You may continue to receive fluids and medicines through an IV.  You will have some pain. Medicines will be available to help control your pain.  To help prevent blood clots: ? You may be given medicines. ? You may have to wear compression stockings or devices. ? You will be encouraged to walk around when you are able.  Hospital staff will encourage and support bonding with your baby. Your hospital may have you and your baby to stay in the same room (rooming in) during your hospital stay to encourage successful bonding and breastfeeding.  You may be encouraged to cough and breathe deeply often. This helps to prevent lung problems.  If you have a catheter draining your urine, it will be removed as soon as possible after your procedure. Summary  Cesarean birth, or cesarean delivery, is the surgical delivery of a baby through an incision in the abdomen and the uterus.  Follow instructions from your health care provider about eating or  drinking restrictions before the procedure.  You will have some pain after the procedure. Medicines will be available to help control your pain.  Hospital staff will encourage and support bonding with your baby after the procedure. Your hospital may have you and your baby to stay in the same room (rooming in) during your hospital stay to encourage successful bonding and breastfeeding. This information is not intended to replace advice given to you by your health care provider. Make sure you discuss any questions you have with your health care provider. Document Revised: 06/25/2017 Document Reviewed: 06/25/2017 Elsevier Patient Education  2020 [employer] Inc.  

## 2020-01-08 NOTE — Progress Notes (Signed)
   PRENATAL VISIT NOTE  Subjective:  Patty Smith is a 30 y.o. G2P1001 at [redacted]w[redacted]d being seen today for ongoing prenatal care.  She is currently monitored for the following issues for this high-risk pregnancy and has TOBACCO USER; DEPRESSION; BACK PAIN, LUMBAR, CHRONIC; Supervision of high risk pregnancy, antepartum; Obesity in pregnancy; Anxiety; History of C-section; Asymptomatic bacteriuria; Alpha thalassemia silent carrier; and Gestational diabetes on their problem list.  Patient reports no complaints.  Contractions: Irritability. Vag. Bleeding: None.  Movement: Present. Denies leaking of fluid.   The following portions of the patient's history were reviewed and updated as appropriate: allergies, current medications, past family history, past medical history, past social history, past surgical history and problem list.   Objective:   Vitals:   01/08/20 0826  BP: 137/76  Pulse: (!) 104  Weight: 239 lb 14.4 oz (108.8 kg)    Fetal Status: Fetal Heart Rate (bpm): 157   Movement: Present     General:  Alert, oriented and cooperative. Patient is in no acute distress.  Skin: Skin is warm and dry. No rash noted.   Cardiovascular: Normal heart rate noted  Respiratory: Normal respiratory effort, no problems with respiration noted  Abdomen: Soft, gravid, appropriate for gestational age.  Pain/Pressure: Present     Pelvic: Cervical exam deferred        Extremities: Normal range of motion.  Edema: None  Mental Status: Normal mood and affect. Normal behavior. Normal judgment and thought content.   Assessment and Plan:  Pregnancy: G2P1001 at [redacted]w[redacted]d 1. Diet controlled gestational diabetes mellitus (GDM) in third trimester FBS 50% >95, will make no change at this stage  2. Supervision of high risk pregnancy, antepartum   3. History of C-section RCS in 2 days  4. TOBACCO USER   Term labor symptoms and general obstetric precautions including but not limited to vaginal bleeding, contractions,  leaking of fluid and fetal movement were reviewed in detail with the patient. Please refer to After Visit Summary for other counseling recommendations.   Return if symptoms worsen or fail to improve, for CS 01/10/19.  Future Appointments  Date Time Provider Department Center  01/08/2020  9:00 AM [facility]-LD PAT 1 [facility]INDC None  01/08/2020  9:55 AM [facility]-SCREENING [facility]SDSC None    Scheryl Darter, MD

## 2020-01-09 LAB — RPR: RPR Ser Ql: NONREACTIVE

## 2020-01-10 ENCOUNTER — Encounter (HOSPITAL_COMMUNITY)
Admission: RE | Disposition: A | Discharge status: Home / Self Care | Payer: Self-pay | Source: Home / Self Care | Attending: Obstetrics and Gynecology

## 2020-01-10 ENCOUNTER — Other Ambulatory Visit: Payer: Self-pay

## 2020-01-10 ENCOUNTER — Inpatient Hospital Stay (HOSPITAL_COMMUNITY): Payer: Medicaid Other | Admitting: Anesthesiology

## 2020-01-10 ENCOUNTER — Encounter (HOSPITAL_COMMUNITY): Payer: Self-pay | Admitting: Obstetrics and Gynecology

## 2020-01-10 ENCOUNTER — Inpatient Hospital Stay (HOSPITAL_COMMUNITY)
Admission: RE | Admit: 2020-01-10 | Discharge: 2020-01-13 | DRG: 788 | Disposition: A | Discharge status: Home / Self Care | Payer: Medicaid Other | Attending: Obstetrics and Gynecology | Admitting: Obstetrics and Gynecology

## 2020-01-10 DIAGNOSIS — O24419 Gestational diabetes mellitus in pregnancy, unspecified control: Secondary | ICD-10-CM | POA: Diagnosis present

## 2020-01-10 DIAGNOSIS — O99215 Obesity complicating the puerperium: Secondary | ICD-10-CM | POA: Diagnosis not present

## 2020-01-10 DIAGNOSIS — O99210 Obesity complicating pregnancy, unspecified trimester: Secondary | ICD-10-CM | POA: Diagnosis present

## 2020-01-10 DIAGNOSIS — O99214 Obesity complicating childbirth: Secondary | ICD-10-CM | POA: Diagnosis present

## 2020-01-10 DIAGNOSIS — O24420 Gestational diabetes mellitus in childbirth, diet controlled: Secondary | ICD-10-CM

## 2020-01-10 DIAGNOSIS — O34211 Maternal care for low transverse scar from previous cesarean delivery: Secondary | ICD-10-CM

## 2020-01-10 DIAGNOSIS — O99335 Smoking (tobacco) complicating the puerperium: Secondary | ICD-10-CM | POA: Diagnosis not present

## 2020-01-10 DIAGNOSIS — F17200 Nicotine dependence, unspecified, uncomplicated: Secondary | ICD-10-CM | POA: Diagnosis present

## 2020-01-10 DIAGNOSIS — Z720 Tobacco use: Secondary | ICD-10-CM | POA: Diagnosis not present

## 2020-01-10 DIAGNOSIS — F419 Anxiety disorder, unspecified: Secondary | ICD-10-CM | POA: Diagnosis not present

## 2020-01-10 DIAGNOSIS — Z3A39 39 weeks gestation of pregnancy: Secondary | ICD-10-CM

## 2020-01-10 DIAGNOSIS — Z8616 Personal history of COVID-19: Secondary | ICD-10-CM

## 2020-01-10 DIAGNOSIS — O99893 Other specified diseases and conditions complicating puerperium: Secondary | ICD-10-CM | POA: Diagnosis not present

## 2020-01-10 DIAGNOSIS — Z148 Genetic carrier of other disease: Secondary | ICD-10-CM | POA: Diagnosis not present

## 2020-01-10 DIAGNOSIS — Z87891 Personal history of nicotine dependence: Secondary | ICD-10-CM | POA: Diagnosis not present

## 2020-01-10 DIAGNOSIS — D563 Thalassemia minor: Secondary | ICD-10-CM | POA: Diagnosis present

## 2020-01-10 DIAGNOSIS — O34219 Maternal care for unspecified type scar from previous cesarean delivery: Secondary | ICD-10-CM | POA: Diagnosis present

## 2020-01-10 DIAGNOSIS — O99345 Other mental disorders complicating the puerperium: Secondary | ICD-10-CM | POA: Diagnosis not present

## 2020-01-10 DIAGNOSIS — E669 Obesity, unspecified: Secondary | ICD-10-CM | POA: Diagnosis not present

## 2020-01-10 DIAGNOSIS — R030 Elevated blood-pressure reading, without diagnosis of hypertension: Secondary | ICD-10-CM | POA: Diagnosis not present

## 2020-01-10 DIAGNOSIS — Z98891 History of uterine scar from previous surgery: Secondary | ICD-10-CM | POA: Diagnosis not present

## 2020-01-10 LAB — GLUCOSE, CAPILLARY: Glucose-Capillary: 94 mg/dL (ref 70–99)

## 2020-01-10 SURGERY — Surgical Case
Anesthesia: Spinal

## 2020-01-10 MED ORDER — WITCH HAZEL-GLYCERIN EX PADS: 1.0000 "application " | MEDICATED_PAD | CUTANEOUS | Status: DC | PRN

## 2020-01-10 MED ORDER — IBUPROFEN 800 MG PO TABS
800.0000 mg | ORAL_TABLET | Freq: Four times a day (QID) | ORAL | Status: DC
  Administered 2020-01-11 – 2020-01-13 (×9): 800 mg via ORAL
  Filled 2020-01-10 (×9): qty 1

## 2020-01-10 MED ORDER — DIBUCAINE (PERIANAL) 1 % EX OINT: 1.0000 "application " | TOPICAL_OINTMENT | CUTANEOUS | Status: DC | PRN

## 2020-01-10 MED ORDER — FENTANYL CITRATE (PF) 100 MCG/2ML IJ SOLN
INTRAMUSCULAR | Status: AC
  Filled 2020-01-10: qty 2

## 2020-01-10 MED ORDER — ACETAMINOPHEN 500 MG PO TABS
1000.0000 mg | ORAL_TABLET | Freq: Four times a day (QID) | ORAL | Status: DC
  Administered 2020-01-10 – 2020-01-13 (×11): 1000 mg via ORAL
  Filled 2020-01-10 (×12): qty 2

## 2020-01-10 MED ORDER — FENTANYL CITRATE (PF) 100 MCG/2ML IJ SOLN
25.0000 ug | INTRAMUSCULAR | Status: DC | PRN
  Administered 2020-01-10: 50 ug via INTRAVENOUS

## 2020-01-10 MED ORDER — OXYCODONE HCL 5 MG PO TABS
5.0000 mg | ORAL_TABLET | ORAL | Status: DC | PRN
  Administered 2020-01-11: 10 mg via ORAL
  Administered 2020-01-11: 5 mg via ORAL
  Administered 2020-01-12 – 2020-01-13 (×4): 10 mg via ORAL
  Filled 2020-01-10 (×5): qty 2
  Filled 2020-01-10: qty 1

## 2020-01-10 MED ORDER — COCONUT OIL OIL
1.0000 "application " | TOPICAL_OIL | Status: DC | PRN
  Administered 2020-01-12: 1 via TOPICAL

## 2020-01-10 MED ORDER — PHENYLEPHRINE 40 MCG/ML (10ML) SYRINGE FOR IV PUSH (FOR BLOOD PRESSURE SUPPORT)
PREFILLED_SYRINGE | INTRAVENOUS | Status: AC
  Filled 2020-01-10: qty 20

## 2020-01-10 MED ORDER — PRENATAL MULTIVITAMIN CH
1.0000 | ORAL_TABLET | Freq: Every day | ORAL | Status: DC
  Administered 2020-01-10 – 2020-01-12 (×3): 1 via ORAL
  Filled 2020-01-10 (×3): qty 1

## 2020-01-10 MED ORDER — ONDANSETRON HCL 4 MG/2ML IJ SOLN
INTRAMUSCULAR | Status: DC | PRN
  Administered 2020-01-10: 4 mg via INTRAVENOUS

## 2020-01-10 MED ORDER — OXYTOCIN-SODIUM CHLORIDE 30-0.9 UT/500ML-% IV SOLN
2.5000 [IU]/h | INTRAVENOUS | Status: AC
  Administered 2020-01-10: 2.5 [IU]/h via INTRAVENOUS
  Filled 2020-01-10: qty 500

## 2020-01-10 MED ORDER — SOD CITRATE-CITRIC ACID 500-334 MG/5ML PO SOLN
30.0000 mL | ORAL | Status: AC
  Administered 2020-01-10: 30 mL via ORAL

## 2020-01-10 MED ORDER — BUPIVACAINE IN DEXTROSE 0.75-8.25 % IT SOLN
INTRATHECAL | Status: DC | PRN
  Administered 2020-01-10: 1.6 mL via INTRATHECAL

## 2020-01-10 MED ORDER — PHENYLEPHRINE HCL-NACL 20-0.9 MG/250ML-% IV SOLN
INTRAVENOUS | Status: AC
  Filled 2020-01-10: qty 500

## 2020-01-10 MED ORDER — FENTANYL CITRATE (PF) 100 MCG/2ML IJ SOLN
INTRAMUSCULAR | Status: DC | PRN
  Administered 2020-01-10: 15 ug via INTRATHECAL

## 2020-01-10 MED ORDER — MORPHINE SULFATE (PF) 0.5 MG/ML IJ SOLN
INTRAMUSCULAR | Status: AC
  Filled 2020-01-10: qty 10

## 2020-01-10 MED ORDER — CEFAZOLIN SODIUM-DEXTROSE 2-4 GM/100ML-% IV SOLN
INTRAVENOUS | Status: AC
  Filled 2020-01-10: qty 100

## 2020-01-10 MED ORDER — ONDANSETRON HCL 4 MG/2ML IJ SOLN
INTRAMUSCULAR | Status: AC
  Filled 2020-01-10: qty 4

## 2020-01-10 MED ORDER — PHENYLEPHRINE HCL-NACL 20-0.9 MG/250ML-% IV SOLN
INTRAVENOUS | Status: DC | PRN
  Administered 2020-01-10: 60 ug/min via INTRAVENOUS

## 2020-01-10 MED ORDER — CEFAZOLIN SODIUM-DEXTROSE 2-4 GM/100ML-% IV SOLN: 2.0000 g | INTRAVENOUS | Status: DC

## 2020-01-10 MED ORDER — KETOROLAC TROMETHAMINE 30 MG/ML IJ SOLN
INTRAMUSCULAR | Status: AC
  Filled 2020-01-10: qty 1

## 2020-01-10 MED ORDER — SIMETHICONE 80 MG PO CHEW
80.0000 mg | CHEWABLE_TABLET | Freq: Three times a day (TID) | ORAL | Status: DC
  Administered 2020-01-10 – 2020-01-13 (×8): 80 mg via ORAL
  Filled 2020-01-10 (×8): qty 1

## 2020-01-10 MED ORDER — KETOROLAC TROMETHAMINE 30 MG/ML IJ SOLN
30.0000 mg | Freq: Four times a day (QID) | INTRAMUSCULAR | Status: AC
  Administered 2020-01-10 – 2020-01-11 (×3): 30 mg via INTRAVENOUS
  Filled 2020-01-10 (×3): qty 1

## 2020-01-10 MED ORDER — SOD CITRATE-CITRIC ACID 500-334 MG/5ML PO SOLN
ORAL | Status: AC
  Filled 2020-01-10: qty 30

## 2020-01-10 MED ORDER — SENNOSIDES-DOCUSATE SODIUM 8.6-50 MG PO TABS
2.0000 | ORAL_TABLET | Freq: Every day | ORAL | Status: DC
  Administered 2020-01-11 – 2020-01-13 (×3): 2 via ORAL
  Filled 2020-01-10 (×3): qty 2

## 2020-01-10 MED ORDER — MORPHINE SULFATE (PF) 0.5 MG/ML IJ SOLN
INTRAMUSCULAR | Status: DC | PRN
  Administered 2020-01-10: .15 mg via INTRATHECAL

## 2020-01-10 MED ORDER — DEXAMETHASONE SODIUM PHOSPHATE 4 MG/ML IJ SOLN
INTRAMUSCULAR | Status: AC
  Filled 2020-01-10: qty 4

## 2020-01-10 MED ORDER — SIMETHICONE 80 MG PO CHEW: 80.0000 mg | CHEWABLE_TABLET | ORAL | Status: DC | PRN

## 2020-01-10 MED ORDER — OXYTOCIN-SODIUM CHLORIDE 30-0.9 UT/500ML-% IV SOLN
INTRAVENOUS | Status: DC | PRN
  Administered 2020-01-10: 300 mL via INTRAVENOUS

## 2020-01-10 MED ORDER — OXYTOCIN-SODIUM CHLORIDE 30-0.9 UT/500ML-% IV SOLN
INTRAVENOUS | Status: AC
  Filled 2020-01-10: qty 1000

## 2020-01-10 MED ORDER — KETOROLAC TROMETHAMINE 30 MG/ML IJ SOLN
INTRAMUSCULAR | Status: DC | PRN
  Administered 2020-01-10: 30 mg via INTRAVENOUS

## 2020-01-10 MED ORDER — CEFAZOLIN SODIUM-DEXTROSE 2-3 GM-%(50ML) IV SOLR
INTRAVENOUS | Status: DC | PRN
  Administered 2020-01-10: 2 g via INTRAVENOUS

## 2020-01-10 MED ORDER — ENOXAPARIN SODIUM 60 MG/0.6ML ~~LOC~~ SOLN
0.5000 mg/kg | SUBCUTANEOUS | Status: DC
  Administered 2020-01-11 – 2020-01-13 (×3): 55 mg via SUBCUTANEOUS
  Filled 2020-01-10 (×3): qty 0.6

## 2020-01-10 MED ORDER — MENTHOL 3 MG MT LOZG: 1.0000 | LOZENGE | OROMUCOSAL | Status: DC | PRN

## 2020-01-10 MED ORDER — DEXAMETHASONE SODIUM PHOSPHATE 4 MG/ML IJ SOLN
INTRAMUSCULAR | Status: DC | PRN
  Administered 2020-01-10: 8 mg via INTRAVENOUS

## 2020-01-10 MED ORDER — METOCLOPRAMIDE HCL 5 MG/ML IJ SOLN
INTRAMUSCULAR | Status: AC
  Filled 2020-01-10: qty 4

## 2020-01-10 MED ORDER — TETANUS-DIPHTH-ACELL PERTUSSIS 5-2.5-18.5 LF-MCG/0.5 IM SUSY: 0.5000 mL | PREFILLED_SYRINGE | Freq: Once | INTRAMUSCULAR | Status: DC

## 2020-01-10 MED ORDER — DIPHENHYDRAMINE HCL 25 MG PO CAPS
25.0000 mg | ORAL_CAPSULE | Freq: Four times a day (QID) | ORAL | Status: DC | PRN
  Administered 2020-01-10: 25 mg via ORAL
  Filled 2020-01-10: qty 1

## 2020-01-10 MED ORDER — LACTATED RINGERS IV SOLN: INTRAVENOUS | Status: DC | PRN

## 2020-01-10 SURGICAL SUPPLY — 33 items
BENZOIN TINCTURE PRP APPL 2/3 (GAUZE/BANDAGES/DRESSINGS) ×2 IMPLANT
CANISTER SUCT 3000ML PPV (MISCELLANEOUS) ×2 IMPLANT
CHLORAPREP W/TINT 26ML (MISCELLANEOUS) ×2 IMPLANT
DRSG OPSITE POSTOP 4X10 (GAUZE/BANDAGES/DRESSINGS) ×2 IMPLANT
ELECT REM PT RETURN 9FT ADLT (ELECTROSURGICAL) ×2
ELECTRODE REM PT RTRN 9FT ADLT (ELECTROSURGICAL) ×1 IMPLANT
EXTRACTOR VACUUM KIWI (MISCELLANEOUS) ×2 IMPLANT
GLOVE BIOGEL PI IND STRL 7.0 (GLOVE) ×2 IMPLANT
GLOVE BIOGEL PI IND STRL 7.5 (GLOVE) ×1 IMPLANT
GLOVE BIOGEL PI INDICATOR 7.0 (GLOVE) ×2
GLOVE BIOGEL PI INDICATOR 7.5 (GLOVE) ×1
GLOVE SKINSENSE NS SZ7.0 (GLOVE) ×1
GLOVE SKINSENSE STRL SZ7.0 (GLOVE) ×1 IMPLANT
GOWN STRL REUS W/ TWL LRG LVL3 (GOWN DISPOSABLE) ×2 IMPLANT
GOWN STRL REUS W/ TWL XL LVL3 (GOWN DISPOSABLE) ×1 IMPLANT
GOWN STRL REUS W/TWL LRG LVL3 (GOWN DISPOSABLE) ×4
GOWN STRL REUS W/TWL XL LVL3 (GOWN DISPOSABLE) ×2
NS IRRIG 1000ML POUR BTL (IV SOLUTION) ×2 IMPLANT
PACK C SECTION WH (CUSTOM PROCEDURE TRAY) ×2 IMPLANT
PAD ABD 7.5X8 STRL (GAUZE/BANDAGES/DRESSINGS) ×2 IMPLANT
PAD OB MATERNITY 4.3X12.25 (PERSONAL CARE ITEMS) ×2 IMPLANT
PAD PREP 24X48 CUFFED NSTRL (MISCELLANEOUS) ×2 IMPLANT
PENCIL SMOKE EVAC W/HOLSTER (ELECTROSURGICAL) ×2 IMPLANT
STRIP CLOSURE SKIN 1/2X4 (GAUZE/BANDAGES/DRESSINGS) ×2 IMPLANT
SUT MNCRL 0 VIOLET CTX 36 (SUTURE) ×2 IMPLANT
SUT MON AB 4-0 PS1 27 (SUTURE) ×2 IMPLANT
SUT MONOCRYL 0 CTX 36 (SUTURE) ×4
SUT PLAIN 2 0 XLH (SUTURE) ×2 IMPLANT
SUT VIC AB 0 CT1 36 (SUTURE) ×4 IMPLANT
SUT VIC AB 3-0 CT1 27 (SUTURE) ×2
SUT VIC AB 3-0 CT1 TAPERPNT 27 (SUTURE) ×1 IMPLANT
TOWEL OR 17X24 6PK STRL BLUE (TOWEL DISPOSABLE) ×4 IMPLANT
WATER STERILE IRR 1000ML POUR (IV SOLUTION) ×2 IMPLANT

## 2020-01-10 NOTE — Lactation Note (Signed)
This note was copied from a baby's chart. Lactation Consultation Note  Patient Name: Patty Smith [id number] Date: 01/10/2020 Reason for consult: Initial assessment;Term;1st time breastfeeding Age:32 hours   P2 mother whose infant is now 70 hours old.  This is a term baby at 39+2 weeks.    Baby was STS and asleep on mother's chest when I arrived.  Reviewed breast feeding basics with parents including STS, feeding cues and hand expression.  Colostrum container provided and milk storage times reviewed.  Finger feeding demonstrated.  Suggested mother observe for cues and call back for latch assistance when baby is ready to feed again.  Offered reassurance that her RN/LC will be available to assist.    Mother is awaiting her DEBP from [employer].  She also plans to contact [facility] regarding a DEBP on Monday.    Mom made aware of O/P services, breastfeeding support groups, community resources, and our phone # for post-discharge questions. Father present.  RN updated.   Maternal Data Formula Feeding for Exclusion: No Has patient been taught Hand Expression?: Yes  Feeding Feeding Type: Breast Fed  LATCH Score Latch: Repeated attempts needed to sustain latch, nipple held in mouth throughout feeding, stimulation needed to elicit sucking reflex.  Audible Swallowing: Spontaneous and intermittent  Type of Nipple: Everted at rest and after stimulation  Comfort (Breast/Nipple): Soft / non-tender  Hold (Positioning): Assistance needed to correctly position infant at breast and maintain latch.  LATCH Score: 8  Interventions    Lactation Tools Discussed/Used WIC Program: Yes   Consult Status Consult Status: Follow-up Date: 01/11/20 Follow-up type: In-patient    Anairis Knick R Amelie Caracci 01/10/2020, 2:23 PM

## 2020-01-10 NOTE — Anesthesia Procedure Notes (Signed)
Spinal  Patient location during procedure: OR Start time: 01/10/2020 9:30 AM End time: 01/10/2020 9:40 AM Staffing Performed: anesthesiologist  Anesthesiologist: Elmer Picker, MD Preanesthetic Checklist Completed: patient identified, IV checked, risks and benefits discussed, surgical consent, monitors and equipment checked, pre-op evaluation and timeout performed Spinal Block Patient position: sitting Prep: DuraPrep and site prepped and draped Patient monitoring: cardiac monitor, continuous pulse ox and blood pressure Approach: midline Location: L3-4 Injection technique: single-shot Needle Needle type: Pencan  Needle gauge: 24 G Needle length: 9 cm Assessment Sensory level: T6 Additional Notes Functioning IV was confirmed and monitors were applied. Sterile prep and drape, including hand hygiene and sterile gloves were used. The patient was positioned and the spine was prepped. The skin was anesthetized with lidocaine.  Free flow of clear CSF was obtained prior to injecting local anesthetic into the CSF.  The spinal needle aspirated freely following injection.  The needle was carefully withdrawn.  The patient tolerated the procedure well.

## 2020-01-10 NOTE — Discharge Summary (Signed)
Postpartum Discharge Summary       Patient Name: Patty Smith DOB: 1990/05/23 MRN: [id number]  Date of admission: 01/10/2020 Delivery date:01/10/2020  Delivering provider: Chancy Milroy  Date of discharge: 01/13/2020  Admitting diagnosis: History of cesarean delivery, currently pregnant [O34.219] Intrauterine pregnancy: [redacted]w[redacted]d    Secondary diagnosis:  Principal Problem:   Cesarean delivery delivered Active Problems:   TOBACCO USER   Obesity in pregnancy   Anxiety   Alpha thalassemia silent carrier   Gestational diabetes   History of cesarean delivery, currently pregnant  Additional problems: as noted above   Discharge diagnosis: Cesarean delivery delivered                                            Post partum procedures: Augmentation: N/A Complications: None  Hospital course: Sceduled C/S   30y.o. yo G2P2002 at 323w2das admitted to the hospital 01/10/2020 for scheduled cesarean section with the following indication:h/o Cesarean x1 (Elective Repeat).Delivery details are as follows:  Membrane Rupture Time/Date: 9:57 AM ,01/10/2020   Delivery Method:C-Section, Low Transverse  Details of operation can be found in separate operative note.  Patient had an uncomplicated postpartum course.  She is ambulating, tolerating a regular diet, passing flatus, and urinating well. Patient is discharged home in stable condition on  01/13/20        Newborn Data: Birth date:01/10/2020  Birth time:9:58 AM  Gender:Female  Living status:Living  Apgars:8 ,9  Weight:3460 g     Magnesium Sulfate received: No BMZ received: No Rhophylac:N/A MMR:N/A T-DaP:Given prenatally Flu: Yes Transfusion:No  Physical exam  Vitals:   01/12/20 1710 01/12/20 1921 01/12/20 2145 01/13/20 0551  BP: (!) 150/86 129/82 127/84 140/90  Pulse: 80 90 90 86  Resp: _0 Temp: 98.7 F (37.1 C) 98.5 F (36.9 C) 97.7 F (36.5 C) 99.1 F (37.3 C)  TempSrc: Oral Oral Axillary Axillary  SpO2:  100% 100% 100%   Weight:      Height:       General: alert, cooperative and no distress Lochia: appropriate Uterine Fundus: firm Incision: Dressing is clean, dry, and intact DVT Evaluation: No evidence of DVT seen on physical exam. Negative Homan's sign. No cords or calf tenderness. Labs: Lab Results  Component Value Date   WBC 18.1 (H) 01/11/2020   HGB 10.1 (L) 01/11/2020   HCT 31.1 (L) 01/11/2020   MCV 86.6 01/11/2020   PLT 194 01/11/2020   CMP Latest Ref Rng & Units 09/17/2019  Glucose 70 - 99 mg/dL 93  BUN 6 - 20 mg/dL 5(L)  Creatinine 0.44 - 1.00 mg/dL 0.54  Sodium 135 - 145 mmol/L 135  Potassium 3.5 - 5.1 mmol/L 4.2  Chloride 98 - 111 mmol/L 104  CO2 22 - 32 mmol/L 25  Calcium 8.9 - 10.3 mg/dL 8.5(L)  Total Protein 6.5 - 8.1 g/dL -  Total Bilirubin 0.3 - 1.2 mg/dL -  Alkaline Phos 38 - 126 U/L -  AST 15 - 41 U/L -  ALT 0 - 44 U/L -   Edinburgh Score: Edinburgh Postnatal Depression Scale Screening Tool 01/12/2020  I have been able to laugh and see the funny side of things. (No Data)     After visit meds:  Allergies as of 01/13/2020      Reactions   Sulfur    Unknown rxn  Medication List    STOP taking these medications   Accu-Chek Guide test strip Generic drug: glucose blood   Accu-Chek Softclix Lancets lancets   aspirin EC 81 MG tablet   Blood Pressure Kit Devi   cyclobenzaprine 10 MG tablet Commonly known as: FLEXERIL   famotidine 20 MG tablet Commonly known as: Pepcid     TAKE these medications   acetaminophen 500 MG tablet Commonly known as: TYLENOL Take 2 tablets (1,000 mg total) by mouth every 8 (eight) hours as needed. What changed: Another medication with the same name was added. Make sure you understand how and when to take each.   acetaminophen 500 MG tablet Commonly known as: TYLENOL Take 2 tablets (1,000 mg total) by mouth every 6 (six) hours. What changed: You were already taking a medication with the same name, and this prescription was  added. Make sure you understand how and when to take each.   amLODipine 5 MG tablet Commonly known as: NORVASC Take 1 tablet (5 mg total) by mouth daily.   ibuprofen 800 MG tablet Commonly known as: ADVIL Take 1 tablet (800 mg total) by mouth every 6 (six) hours.   norethindrone 0.35 MG tablet Commonly known as: Ortho Micronor Take 1 tablet (0.35 mg total) by mouth daily. Start taking the Sunday after the baby turns 12 weeks old   oxyCODONE 5 MG immediate release tablet Commonly known as: Oxy IR/ROXICODONE Take 1-2 tablets (5-10 mg total) by mouth every 4 (four) hours as needed for moderate pain.   PRENATAL VITAMIN PO Take 1 tablet by mouth daily.        Discharge home in stable condition Infant Feeding: Breast Infant Disposition:home with mother Discharge instruction: per After Visit Summary and Postpartum booklet. Activity: Advance as tolerated. Pelvic rest for 6 weeks.  Diet: routine diet Future Appointments: Future Appointments  Date Time Provider   01/20/2020 10:00 AM [facility] NURSE [facility]  01/26/2020  9:15 AM [facility]  02/23/2020  9:30 AM [facility] LAB [facility]  02/23/2020 10:55 AM Aletha Halim, MD [facility]   Follow up Visit: Message sent to [facility] clinic on 01/10/20 to schedule PP appt.  Please schedule this patient for a In person postpartum visit in 6 weeks with the following provider: Any provider. Additional Postpartum F/U:Postpartum Depression checkup, 2 hour GTT and Incision check 1 week  High risk pregnancy complicated by: now h/o Cesarean x2, depression/anxiety (no meds), COVID in pregnancy (diagnosed 9/15), A1GDM, h/o tobacco use Delivery mode:  C-Section, Low Transverse  Anticipated Birth Control:  desires [facility]Ps at [facility] appt   01/13/2020 Christin Fudge, CNM

## 2020-01-10 NOTE — Op Note (Signed)
Patty Smith   PROCEDURE DATE: 01/10/2020  PREOPERATIVE DIAGNOSES: Intrauterine pregnancy at [redacted]w[redacted]d weeks gestation; H/o Cesarean x1 (arrest of dilation), A1GDM  POSTOPERATIVE DIAGNOSES: The same  PROCEDURE: Repeat Low Transverse Cesarean Section  SURGEON:  Dr. Nettie Elm  ASSISTANT:  Dr. Lynnda Shields  ANESTHESIOLOGY TEAM: Anesthesiologist: Elmer Picker, MD CRNA: Graciela Husbands, CRNA  INDICATIONS: Patty Smith is a 30 y.o. G2P1001 at [redacted]w[redacted]d here for cesarean section secondary to the indications listed under preoperative diagnoses; please see preoperative note for further details.  The risks of surgery were discussed with the patient including but were not limited to: bleeding which may require transfusion or reoperation; infection which may require antibiotics; injury to bowel, bladder, ureters or other surrounding organs; injury to the fetus; need for additional procedures including hysterectomy in the event of a life-threatening hemorrhage; formation of adhesions; placental abnormalities wth subsequent pregnancies; incisional problems; thromboembolic phenomenon and other postoperative/anesthesia complications.  The patient concurred with the proposed plan, giving informed written consent for the procedure.    FINDINGS:  Viable female infant in cephalic presentation.  Apgars 8 and 9.  Clear amniotic fluid.  Intact placenta, three vessel cord.  Normal uterus, normal left fallopian tube and ovary; unable to visualize right fallopian tube and ovary secondary to adhesions. Moderate adhesions along right abdominal wall and uterus.  ANESTHESIA: Spinal INTRAVENOUS FLUIDS: 2500 ml   ESTIMATED BLOOD LOSS: 366 ml URINE OUTPUT:  250 ml SPECIMENS: Placenta sent to L&D COMPLICATIONS: None immediate  PROCEDURE IN DETAIL:  The patient preoperatively received intravenous antibiotics and had sequential compression devices applied to her lower extremities.  She was then taken to the operating room where  spinal anesthesia was administered and was found to be adequate. She was then placed in a dorsal supine position with a leftward tilt, and prepped and draped in a sterile manner.  A foley catheter was placed into her bladder and attached to constant gravity.  After an adequate timeout was performed, a Pfannenstiel skin incision was made with scalpel along her preexisting scar and carried through to the underlying layer of fascia. The fascia was incised in the midline, and this incision was extended bilaterally using the Mayo scissors.  Kocher clamps were applied to the superior aspect of the fascial incision and the underlying rectus muscles were dissected off bluntly and sharply.  A similar process was carried out on the inferior aspect of the fascial incision. Moderate adhesions also the right abdominal wall and uterus were excised sharply and with use of Bovie. The rectus muscles were separated in the midline and the peritoneum was entered bluntly. The Alexis self-retaining retractor was introduced into the abdominal cavity.  Attention was turned to the lower uterine segment where a low transverse hysterotomy was made with a scalpel and extended bilaterally bluntly.  The infant was successfully delivered, the cord was clamped and cut after one minute, and the infant was handed over to the awaiting neonatology team. Uterine massage was then administered, and the placenta delivered intact with a three-vessel cord. The uterus was then cleared of clots and debris.  The hysterotomy was closed with 0 Chromic in a running locked fashion, and an imbricating layer was also placed with 0 Vicryl.  Several interrupted serosal stitches were placed to help with hemostasis.  The pelvis was cleared of all clot and debris. Hemostasis was confirmed on all surfaces.  The retractor was removed.  The peritoneum and rectus muscles were closed with a 2-0 Vicryl running stitch. The  fascia was then closed using 1-0 Vicryl in a running  fashion.  The subcutaneous layer was irrigated, then reapproximated with 3-0 vicryl interrupted stitches, and the skin was closed with a 4-0 Vicryl subcuticular stitch. The patient tolerated the procedure well. Sponge, instrument and needle counts were correct x 3.  She was taken to the recovery room in stable condition.   Sheila Oats, MD OB Fellow, Faculty Practice 01/10/2020 11:09 AM

## 2020-01-10 NOTE — Discharge Instructions (Signed)

## 2020-01-10 NOTE — Anesthesia Preprocedure Evaluation (Signed)
Anesthesia Evaluation  Patient identified by MRN, date of birth, ID band Patient awake    Reviewed: Allergy & Precautions, NPO status , Patient's Chart, lab work & pertinent test results  Airway Mallampati: II  TM Distance: >3 FB Neck ROM: Full    Dental no notable dental hx.    Pulmonary neg pulmonary ROS, former smoker,    Pulmonary exam normal breath sounds clear to auscultation       Cardiovascular negative cardio ROS Normal cardiovascular exam Rhythm:Regular Rate:Normal     Neuro/Psych PSYCHIATRIC DISORDERS Anxiety Depression negative neurological ROS     GI/Hepatic Neg liver ROS, GERD  Medicated and Controlled,  Endo/Other  diabetes, GestationalMorbid obesity (BMI 40)  Renal/GU negative Renal ROS  negative genitourinary   Musculoskeletal negative musculoskeletal ROS (+)   Abdominal   Peds  Hematology negative hematology ROS (+)   Anesthesia Other Findings Repeat C/S  Reproductive/Obstetrics (+) Pregnancy                             Anesthesia Physical Anesthesia Plan  ASA: III  Anesthesia Plan: Spinal   Post-op Pain Management:    Induction:   PONV Risk Score and Plan: Treatment may vary due to age or medical condition  Airway Management Planned: Natural Airway  Additional Equipment:   Intra-op Plan:   Post-operative Plan:   Informed Consent: I have reviewed the patients History and Physical, chart, labs and discussed the procedure including the risks, benefits and alternatives for the proposed anesthesia with the patient or authorized representative who has indicated his/her understanding and acceptance.     Dental advisory given  Plan Discussed with: CRNA  Anesthesia Plan Comments:         Anesthesia Quick Evaluation

## 2020-01-10 NOTE — Transfer of Care (Signed)
Immediate Anesthesia Transfer of Care Note  Patient: Patty Smith  Procedure(s) Performed: CESAREAN SECTION (N/A )  Patient Location: PACU  Anesthesia Type:Spinal  Level of Consciousness: awake, alert  and oriented  Airway & Oxygen Therapy: Patient Spontanous Breathing  Post-op Assessment: Report given to RN and Post -op Vital signs reviewed and stable  Post vital signs: Reviewed and stable  Last Vitals:  Vitals Value Taken Time  BP    Temp    Pulse 82 01/10/20 1055  Resp 15 01/10/20 1055  SpO2 100 % 01/10/20 1055  Vitals shown include unvalidated device data.  Last Pain:  Vitals:   01/10/20 0750  TempSrc: Axillary         Complications: No complications documented.

## 2020-01-10 NOTE — H&P (Signed)
OBSTETRIC ADMISSION HISTORY AND PHYSICAL  Patty Smith is a 30 y.o. female G2P1001 with IUP at 96w2dby L/7 presenting for scheduled repeat Cesarean. She reports +FMs, No LOF, no VB, no blurry vision, headaches or peripheral edema, and RUQ pain.  She plans on breast feeding. She requests OCPs for birth control.  [facility] --->  Estimated Date of Delivery: 01/15/20  Sono:  @[redacted]w[redacted]d , CWD, normal anatomy, cephalic presentation,  [id number] 70% EFW  Prenatal History/Complications:  - h/o COVID in pregnancy (09/17/19) - A1GDM - Depression/Anxiety (no meds) - Tobacco use in pregnancy - h/o Cesarean x1 (arrest of dilation @5cm )  Past Medical History: Past Medical History:  Diagnosis Date  . Anxiety   . Complication of anesthesia    epidural /or spinal didin't work- stuck many times  . COVID-19 09/17/2019  . Depression    doing ok  . Endometriosis    not officially diagnosed by surgery or [facility] prior MD treated with ocp's   . Family history of adverse reaction to anesthesia    multiple health issues that impact anesthesia  . Gestational diabetes   . Infection    UTI  . Scarlet fever 2001  . Viral meningitis     Past Surgical History: Past Surgical History:  Procedure Laterality Date  . CESAREAN SECTION      Obstetrical History: OB History    Gravida  2   Para  1   Term  1   Preterm      AB      Living  1     SAB      IAB      Ectopic      Multiple      Live Births  1           Social History Social History   Socioeconomic History  . Marital status: Divorced    Spouse name: Not on file  . Number of children: Not on file  . Years of education: Not on file  . Highest education level: Not on file  Occupational History  . Not on file  Tobacco Use  . Smoking status: Former Smoker    Packs/day: 0.30    Types: Cigarettes    Quit date: 05/05/2019    Years since quitting: 0.6  . Smokeless tobacco: Never Used   Vaping Use  . Vaping Use: Never used  Substance and Sexual Activity  . Alcohol use: Not Currently    Comment: Occ  . Drug use: Not Currently    Types: Marijuana    Comment: last use was 05/05/2019  . Sexual activity: Yes    Birth control/protection: None  Other Topics Concern  . Not on file  Social History Narrative  . Not on file   Social Determinants of Health   Financial Resource Strain: Not on file  Food Insecurity: No Food Insecurity  . Worried About RCharity fundraiserin the Last Year: Never true  . Ran Out of Food in the Last Year: Never true  Transportation Needs: No Transportation Needs  . Lack of Transportation (Medical): No  . Lack of Transportation (Non-Medical): No  Physical Activity: Not on file  Stress: Not on file  Social Connections: Not on file    Family History: Family History  Problem Relation Age of Onset  . Heart failure Father   . Hypertension Father   . Diabetes Father   . Anxiety disorder  Father   . Endometriosis Mother   . Depression Mother   . COPD Mother   . Anxiety disorder Mother     Allergies: Allergies  Allergen Reactions  . Sulfur     Unknown rxn    Medications Prior to Admission  Medication Sig Dispense Refill Last Dose  . acetaminophen (TYLENOL) 500 MG tablet Take 2 tablets (1,000 mg total) by mouth every 8 (eight) hours as needed. 30 tablet 0   . aspirin EC 81 MG tablet Take 1 tablet (81 mg total) by mouth daily. Take after 12 weeks for prevention of preeclampsia later in pregnancy 300 tablet 2   . cyclobenzaprine (FLEXERIL) 10 MG tablet Take 1 tablet (10 mg total) by mouth 3 (three) times daily as needed (back pain). 30 tablet 0   . famotidine (PEPCID) 20 MG tablet Take 1 tablet (20 mg total) by mouth 2 (two) times daily. 60 tablet 5   . Prenatal Vit-Fe Fumarate-FA (PRENATAL VITAMIN PO) Take 1 tablet by mouth daily.     . Accu-Chek Softclix Lancets lancets Use four times per day 100 each 12   . Blood Pressure Monitoring  (BLOOD PRESSURE KIT) DEVI 1 Device by Does not apply route as needed. 1 each 0   . glucose blood (ACCU-CHEK GUIDE) test strip Use four times per day 100 each 12      Review of Systems   All systems reviewed and negative except as stated in HPI  Pulse 99, temperature 98.5 F (36.9 C), temperature source Axillary, resp. rate 18, height 5' 5"  (1.651 m), weight 108.8 kg, last menstrual period 04/03/2019, SpO2 99 %. General appearance: alert, cooperative and appears stated age Lungs: normal WOB Heart: regular rate  Abdomen: soft, non-tender Extremities: no sign of DVT Presentation: cephalic Fetal monitoring: FHT 145 on Dopplers    Prenatal labs: ABO, Rh: --/--/O POS (01/06 0915) Antibody: NEG (01/06 0915) Rubella: 3.41 (07/20 0841) RPR: NON REACTIVE (01/06 0903)  HBsAg: Negative (07/20 0841)  HIV: Non Reactive (10/27 0844)  GBS: Negative/-- (12/20 1156)  1 hr Glucola: 81/185/113 Genetic screening wnl except for silent alpha thal carrier Anatomy US wnl  Prenatal Transfer Tool  Maternal Diabetes: Yes:  Diabetes Type:  Diet controlled Genetic Screening: Normal except for silent alpha thal carrier Maternal Ultrasounds/Referrals: Normal Fetal Ultrasounds or other Referrals:  Referral to MFM Maternal Substance Abuse:  Yes:  Type: Smoker Significant Maternal Medications:  None Significant Maternal Lab Results: Group B Strep negative  Results for orders placed or performed during the hospital encounter of 01/10/20 (from the past 24 hour(s))  Glucose, capillary   Collection Time: 01/10/20  7:53 AM  Result Value Ref Range   Glucose-Capillary 94 70 - 99 mg/dL    Patient Active Problem List   Diagnosis Date Noted  . History of cesarean delivery, currently pregnant 01/10/2020  . Gestational diabetes 10/30/2019  . Alpha thalassemia silent carrier 08/27/2019  . Asymptomatic bacteriuria 07/25/2019  . Supervision of high risk pregnancy, antepartum 07/17/2019  . Obesity in pregnancy  07/17/2019  . Anxiety   . History of C-section   . TOBACCO USER 01/04/2009  . DEPRESSION 11/18/2008  . BACK PAIN, LUMBAR, CHRONIC 11/18/2008    Assessment/Plan:  Patty Smith is a 30 y.o. G2P1001 at 34w2dhere for scheduled elective repeat Cesarean.  #Scheduled Elective Repeat Cesarean: H/o Cesarean x1 for arrest of dilation at 5cm. The risks of cesarean section were discussed with the patient including but were not limited to: bleeding which may require  transfusion or reoperation; infection which may require antibiotics; injury to bowel, bladder, ureters or other surrounding organs; injury to the fetus; need for additional procedures including hysterectomy in the event of a life-threatening hemorrhage; placental abnormalities wth subsequent pregnancies, incisional problems, thromboembolic phenomenon and other postoperative/anesthesia complications.  The patient concurred with the proposed plan, giving informed written consent for the procedure.  Patient has been NPO since yesterday; she will remain NPO for procedure. Anesthesia and OR aware.  Preoperative prophylactic antibiotics and SCDs ordered on call to the OR.  To OR when ready.  #Pain: Per anesthesia #FWB: FHT 145 on Dopplers #ID: GBS neg #MOF: breast #MOC: OCPs #Circ: desired #A1GDM: plan for FBG on POD#1 & repeat 2hr gtt at 6-8 weeks postpartum. #Depression/Anxiety: plan for 1 week mood check #Silent Alpha Thal: peds aware  Patty Smith, Gildardo Cranker, MD OB Fellow, Faculty Practice 01/10/2020 7:56 AM

## 2020-01-11 LAB — CBC
HCT: 31.1 % — ABNORMAL LOW (ref 36.0–46.0)
Hemoglobin: 10.1 g/dL — ABNORMAL LOW (ref 12.0–15.0)
MCH: 28.1 pg (ref 26.0–34.0)
MCHC: 32.5 g/dL (ref 30.0–36.0)
MCV: 86.6 fL (ref 80.0–100.0)
Platelets: 194 10*3/uL (ref 150–400)
RBC: 3.59 MIL/uL — ABNORMAL LOW (ref 3.87–5.11)
RDW: 13.2 % (ref 11.5–15.5)
WBC: 18.1 10*3/uL — ABNORMAL HIGH (ref 4.0–10.5)
nRBC: 0 % (ref 0.0–0.2)

## 2020-01-11 LAB — GLUCOSE, CAPILLARY
Glucose-Capillary: 85 mg/dL (ref 70–99)
Glucose-Capillary: 108 mg/dL — ABNORMAL HIGH (ref 70–99)

## 2020-01-11 NOTE — Progress Notes (Signed)
CSW received consult for hx of Anxiety and Depression.  CSW met with [name] to offer support and complete assessment.    CSW congratulated [name] on the birth of infant. CSW advised [name] of HIPPA policy in which [name] expressed that it was fine for FOB to remain in the room with CSW and [name] while they spoke. CSW understanding and then advised [name] of CSW's role and the reason for CSW's visit. [name] expressed that she was diagnosed with anxiety and depression at the age of 34. [name] reported that she was in therapy from the ages of [age] but reported that she has since stopped therapy. [name] expressed a previous hx of medication use however [name] declined that she is taking medication at this time. [name] reported that she feels that she has been coping well with anxiety and depression but does report that she has times when anxiety and depression become a challenge. [name] expressed she is interested in further therapy resources but does report that she is seeing [facility] counselor. [name] expressed that she has no other mental health hx and denies SI, HI and DV when asked by CSW.   CSW inquired from [facility] on who her supports are. [name] identified family and friends. [name] reported that she has all needed items to care for infant with plans for infant to sleep in basinet once arrived home. [name] expressed that she has been feeling very tired since giving birth but reports a desire to be patient with herself and allow her supports to support her during this time. CSW praised [name] for this thinking and was advised that [name] has no other needs.   CSW provided education regarding the baby blues period vs. perinatal mood disorders, discussed treatment and gave resources for mental health follow up if concerns arise.  CSW recommends self-evaluation during the postpartum time period using the New Mom Checklist from Postpartum Progress and encouraged [name] to contact a medical professional if symptoms are noted at any time.   CSW provided review of Sudden  Infant Death Syndrome (SIDS) precautions.   CSW identifies no further need for intervention and no barriers to discharge at this time.   Patty Smith, MSW, LCSW [facility] [place] [facility] [place] [phone]

## 2020-01-11 NOTE — Progress Notes (Signed)
Post Partum Day 1 Subjective: Patty Smith is a 30 yo G2P2002 who is POD#1. She reports 9/10 abdominal pain that radiates laterally and to her back. She reports passing flatus but no bowel movement and notes her foley was removed this morning and she has not yet urinated. She reports minimal vaginal bleeding. Pt has been able to walk around and states she knows it is her goal to walk at least 3x per day. She reports breastfeeding is going well without pain or difficulty with latching. She wants to start POP contraception and states she does not want another thing "inserted" in her.   Objective: Blood pressure 109/68, pulse 94, temperature 98.3 F (36.8 C), resp. rate 16, height 5\' 5"  (1.651 m), weight 108.8 kg, last menstrual period 04/03/2019, SpO2 99 %, unknown if currently breastfeeding.  Physical Exam:  General: alert, appears stated age and mild distress Heart: regular rate and rhythm  Lungs: lung fields clear to ausculation bilaterally  GI: moderate tenderness to palpation, abdomen soft in all quadrants, bowel sounds normoactive in upper quadrants however difficult to appreciate in lower quadrants  Lochia: appropriate Uterine Fundus: firm Incision: mild blood saturation on L side of honeycomb dressing  DVT Evaluation: No evidence of DVT seen on physical exam. No significant calf/ankle edema. Pedal pulses 3+ bilaterally.   Recent Labs    01/08/20 0903 01/11/20 0531  HGB 11.7* 10.1*  HCT 35.7* 31.1*    Assessment/Plan: POD#1 Low Transverse C-Section -9/10 lateral abdominal and back pain to be managed with Tylenol and Flexeril if not Tylenol not effective  -continue to monitor honeycomb dressing from incision site to ensure appropriate drainage is observed and no further bleeding -voiding trial s/p foley catheter removal this morning  -VSS -continue breastfeeding -desires starting POP contraception after counseling on options    LOS: 1 day   03/10/20 01/11/2020, 8:45 AM

## 2020-01-11 NOTE — Anesthesia Postprocedure Evaluation (Signed)
Anesthesia Post Note  Patient: [name]  Procedure(s) Performed: CESAREAN SECTION (N/A )     Patient location during evaluation: PACU Anesthesia Type: Spinal Level of consciousness: oriented and awake and alert Pain management: pain level controlled Vital Signs Assessment: post-procedure vital signs reviewed and stable Respiratory status: spontaneous breathing, respiratory function stable and patient connected to nasal cannula oxygen Cardiovascular status: blood pressure returned to baseline and stable Postop Assessment: no headache, no backache and no apparent nausea or vomiting Anesthetic complications: no   No complications documented.  Last Vitals:  Vitals:   01/10/20 2225 01/11/20 0300  BP:  118/65  Pulse:  84  Resp:  16  Temp: 36.5 C 36.9 C  SpO2: 100% 99%    Last Pain:  Vitals:   01/11/20 0030  TempSrc:   PainSc: 7    Pain Goal:                   Severino Paolo L Damika Harmon

## 2020-01-11 NOTE — Progress Notes (Signed)
CSW attempted to meet with [name] at bedside. However FOB reported that [name] as in restroom. CSW to return later to assess [name].    Mozetta Murfin S. Yarelie Hams, MSW, LCSW [facility] [place] [phone]   

## 2020-01-11 NOTE — Progress Notes (Signed)
Subjective: Postpartum Day 1: Cesarean Delivery Patient reports tolerating PO, + flatus and no problems voiding.    Objective: Vital signs in last 24 hours: Temp:  [97.7 F (36.5 C)-98.4 F (36.9 C)] 98.3 F (36.8 C) (01/09 0626) Pulse Rate:  [73-94] 94 (01/09 0626) Resp:  [13-19] 16 (01/09 0626) BP: (109-135)/(65-88) 109/68 (01/09 0626) SpO2:  [99 %-100 %] 99 % (01/09 0626)  Physical Exam:  General: alert, cooperative and no distress Heart: RRR Lungs: CTAB Lochia: appropriate Uterine Fundus: firm Incision: healing well, no dehiscence, no significant erythema, red blood to left lateral, outlined DVT Evaluation: No evidence of DVT seen on physical exam. Negative Homan's sign. No cords or calf tenderness. No significant calf/ankle edema.  Recent Labs    01/11/20 0531  HGB 10.1*  HCT 31.1*    Assessment/Plan: Status post Cesarean section. Doing well postoperatively.  Continue current care Anemia- stable A1GDM, delivered- stable BS Consider discharge home tomorrow  Donette Larry, CNM 01/11/2020, 12:02 PM

## 2020-01-11 NOTE — Lactation Note (Signed)
This note was copied from a baby's chart. Lactation Consultation Note  Patient Name: Patty Smith [id number] Date: 01/11/2020 Reason for consult: Follow-up assessment Age:32 hours   P2 mother whose infant is now 40 hours old.  This is a term baby at 39+2 weeks.  RN had called for latch assistance.  When I arrived baby was latched to the right breast in the football hold and was not actively sucking.  Mother stated that he had been feeding actively for most of the time prior to my arrival.  Educated parents on nutritive vs non-nutritive sucking.  Asked mother to remove baby from the breast and he remained sleeping.  He had fed for approximately 30 minutes before my arrival.  Reviewed breast feeding basics.  Baby is showing feeding cues and is content after feedings.  Suggested mother continue practicing hand expression before/after feedings and to feed back any EBM she obtains to baby.  Mother will call for latch assistance as needed.  Both parents receptive to teaching.  Mother is awaiting her DEBP from [employer].  She also plans to contact [facility] regarding a DEBP on Monday.  RN updated.   Maternal Data    Feeding    LATCH Score                   Interventions    Lactation Tools Discussed/Used     Consult Status Consult Status: Follow-up Date: 01/12/20 Follow-up type: In-patient    Dora Sims 01/11/2020, 12:36 PM

## 2020-01-12 ENCOUNTER — Encounter (HOSPITAL_COMMUNITY): Payer: Self-pay

## 2020-01-12 MED ORDER — AMLODIPINE BESYLATE 5 MG PO TABS
5.0000 mg | ORAL_TABLET | Freq: Every day | ORAL | Status: DC
  Administered 2020-01-12 – 2020-01-13 (×2): 5 mg via ORAL
  Filled 2020-01-12 (×2): qty 1

## 2020-01-12 NOTE — Progress Notes (Signed)
Subjective: POD#2 rLTCS  Patient is doing well without complaints. Ambulating without difficulty. Voiding and passing flatus. Tolerating PO. Abdominal pain improved. Vaginal bleeding decreased.  Objective: Vital signs in last 24 hours: Temp:  [98.7 F (37.1 C)-98.9 F (37.2 C)] 98.7 F (37.1 C) (01/10 0543) Pulse Rate:  [84-88] 84 (01/10 0543) Resp:  [15-17] 17 (01/10 0543) BP: (128-138)/(86-99) 136/99 (01/10 0543) SpO2:  [100 %] 100 % (01/10 0543)  Physical Exam:  General: alert, cooperative and no distress Lochia: appropriate Uterine Fundus: firm Incision: dressing c/d/i DVT Evaluation: No evidence of DVT seen on physical exam.  Recent Labs    01/11/20 0531  HGB 10.1*  HCT 31.1*    Assessment/Plan: POD#2 rLTCS  -doing well, meeting PP milestones  -BP elevated, otherwise VSS  -breastfeeding, doing well  -desires POPs for contraception  -desires circ for baby, consented  #A1GDM  -FBGL stable, 2hr gtt at postpartum visit  #Elevated BP  -no prior diagnosis, asymptomatic  -will start amlodipine 5mg  daily and continue to monitor  Patient desires to be discharged tomorrow.  01/12/2020, 6:56 AM

## 2020-01-12 NOTE — Lactation Note (Incomplete)
This note was copied from a baby's chart. Lactation Consultation Note  Patient Name: Patty Smith [id number] Date: 01/12/2020 Reason for consult: Follow-up assessment Age:32 hours  Maternal Data Formula Feeding for Exclusion: No Does the patient have breastfeeding experience prior to this delivery?: Yes  LC Student and LC Linels arrived in the room to complete follow up. [name] and baby in bed. Support person was present. [facility] Student asked about breastfeeding and how things were going. [name] expressed there is some discomfort in her nipples but stated other than that everything is good and she knows the baby is getting milk. [name] stated last feeding was at 11:45am. [name] stated she breastfeed her first child but only pumped. [name] has DEBP coming through mail that should arrive today. [facility] Student student reviewed changes in breast, engorgement, cluster feeding, and milk storage with [name]. LC Student informed [name] about coconut oil use, hand expressing milk onto nipples, and using the lactation book as a reference. LC Linels dicussed position options, use of support pillows, and how to adjust football hold that mom prefers to use.  [name] stated she did not have any further questions.     Lactation Tools Discussed/Used WIC Program: Yes      Patty Smith 01/12/2020, 2:10 PM

## 2020-01-12 NOTE — Lactation Note (Signed)
This note was copied from a baby's chart. Lactation Consultation Note  Patient Name: Patty Smith [id number] Date: 01/12/2020 Reason for consult: Follow-up assessment Age:32 hours  Maternal Data Formula Feeding for Exclusion: No Does the patient have breastfeeding experience prior to this delivery?: Yes  LC Student and LC Ameliya Nicotra arrived in the room to complete follow up. [name] and baby in bed. Support person was present. [facility] Student asked about breastfeeding and how things were going. [name] expressed there is some discomfort in her nipples but stated other than that everything is good and she knows the baby is getting milk. [name] stated last feeding was at 11:45am. [name] stated she breastfeed her first child but only pumped. [name] has DEBP coming through mail that should arrive today. [facility] Student student reviewed changes in breast, engorgement, cluster feeding, and milk storage with [name]. LC Student informed [name] about coconut oil use, hand expressing milk onto nipples, and using the lactation book as a reference. LC Johney Perotti dicussed position options, use of support pillows, and how to adjust football hold that mom prefers to use.  [name] stated she did not have any further questions.   Lactation Tools Discussed/Used WIC Program: Yes  Interventions Interventions: Breast feeding basics reviewed;Hand express;Adjust position;Support pillows;Position options;Expressed milk;Coconut oil;Hand pump;DEBP  Lactation Tools Discussed/Used WIC Program: Yes   Consult Status Consult Status: Follow-up Date: 01/13/20 Follow-up type: In-patient  [name] 01/12/2020, 2:10 PM  [name] 01/12/2020, 2:24 PM

## 2020-01-13 ENCOUNTER — Other Ambulatory Visit (HOSPITAL_COMMUNITY): Payer: Self-pay | Admitting: Advanced Practice Midwife

## 2020-01-13 DIAGNOSIS — E669 Obesity, unspecified: Secondary | ICD-10-CM

## 2020-01-13 DIAGNOSIS — Z148 Genetic carrier of other disease: Secondary | ICD-10-CM

## 2020-01-13 DIAGNOSIS — Z98891 History of uterine scar from previous surgery: Secondary | ICD-10-CM

## 2020-01-13 DIAGNOSIS — O99335 Smoking (tobacco) complicating the puerperium: Secondary | ICD-10-CM

## 2020-01-13 DIAGNOSIS — F419 Anxiety disorder, unspecified: Secondary | ICD-10-CM

## 2020-01-13 DIAGNOSIS — O99345 Other mental disorders complicating the puerperium: Secondary | ICD-10-CM

## 2020-01-13 DIAGNOSIS — Z720 Tobacco use: Secondary | ICD-10-CM

## 2020-01-13 DIAGNOSIS — O99215 Obesity complicating the puerperium: Secondary | ICD-10-CM

## 2020-01-13 MED ORDER — OXYCODONE HCL 5 MG PO TABS: 5.0000 mg | ORAL_TABLET | ORAL | 0 refills | Status: DC | PRN

## 2020-01-13 MED ORDER — AMLODIPINE BESYLATE 5 MG PO TABS: 5.0000 mg | ORAL_TABLET | Freq: Every day | ORAL | 3 refills | Status: AC

## 2020-01-13 MED ORDER — ACETAMINOPHEN 500 MG PO TABS
1000.0000 mg | ORAL_TABLET | Freq: Four times a day (QID) | ORAL | 0 refills | Status: DC
Start: 2020-01-13 — End: 2020-01-13

## 2020-01-13 MED ORDER — IBUPROFEN 800 MG PO TABS: 800.0000 mg | ORAL_TABLET | Freq: Four times a day (QID) | ORAL | 0 refills | Status: DC

## 2020-01-13 MED ORDER — NORETHINDRONE 0.35 MG PO TABS: 1.0000 | ORAL_TABLET | Freq: Every day | ORAL | 11 refills | Status: AC

## 2020-01-13 MED FILL — IBUPROFEN 800 MG TAB: 800 | 7 days supply | Qty: 30 | Fill #0

## 2020-01-13 MED FILL — oxyCODONE HCL 5 MG TABS: 5 | 3 days supply | Qty: 30 | Fill #0

## 2020-01-13 MED FILL — NORETHINDRONE 0.35 MG TAB: 0.35 | 28 days supply | Qty: 28 | Fill #0

## 2020-01-13 MED FILL — ACETAMINOPHEN 500MG XT STRE: 500 | 3 days supply | Qty: 30 | Fill #0

## 2020-01-13 MED FILL — AMLODIPINE BESYLATE 5 MG TA: 5 | 30 days supply | Qty: 30 | Fill #0

## 2020-01-13 NOTE — BH Specialist Note (Signed)
Integrated Behavioral Health via Telemedicine Visit  01/13/2020 Patty Smith [id number]  Number of Integrated Behavioral Health visits: 2 Session Start time: 9:15  Session End time: 9:30 Total time: 15  Referring Provider: Scheryl Darter, MD Patient/Family location: Home [facility] Provider location: [facility]  All persons participating in visit: Patient Patty Smith and [facility] [name]   Types of Service: Individual psychotherapy  I connected with Patty Smith and/or Patty Smith's n/a by Video  (Video is Caregility application) and verified that I am speaking with the correct person using two identifiers.Discussed confidentiality: Yes   I discussed the limitations of telemedicine and the availability of in person appointments.  Discussed there is a possibility of technology failure and discussed alternative modes of communication if that failure occurs.  I discussed that engaging in this telemedicine visit, they consent to the provision of behavioral healthcare and the services will be billed under their insurance.  Patient and/or legal guardian expressed understanding and consented to Telemedicine visit: Yes   Presenting Concerns: Patient and/or family reports the following symptoms/concerns: Pt states she is coping well postpartum, feels she has good support, sleeping and eating well; pt's workplace has been supportive; thinking about extending maternity leave beyond March 2 for more time with baby. Pt has no other concerns at this time.  Duration of problem: Recent pregnancy; Severity of problem: mild  Patient and/or Family's Strengths/Protective Factors: Social connections, Social and Emotional competence, Concrete supports in place (healthy food, safe environments, etc.) and Sense of purpose  Goals Addressed: Patient will: 1.  Maintain reduction of  symptoms of: anxiety, depression and stress  2.  Increase knowledge and/or ability of:  healthy habits  3.  Demonstrate ability to: Increase adequate support systems for patient/family  Progress towards Goals: Ongoing  Interventions: Interventions utilized:  Psychoeducation and/or Health Education and Link to [employer] Standardized Assessments completed: GAD-7 and PHQ 9  Patient and/or Family Response: Pt agrees to revised treatment plan  Assessment: Patient currently experiencing Psychosocial stress.   Patient may benefit from continued psychoeducation and brief therapeutic interventions regarding coping with symptoms of current life stress .  Plan: 1. Follow up with behavioral health clinician on : Call [name] at [phone] as needed 2. Behavioral recommendations:  -Consider continuing to take prenatal vitamin until postpartum visit -Consider registering for and attending new mom support group, as needed, at [email or web address] or [email or web address] -Discuss maternity leave extension with workplace 3. Referral(s): Integrated [employer] (In Clinic)  I discussed the assessment and treatment plan with the patient and/or parent/guardian. They were provided an opportunity to ask questions and all were answered. They agreed with the plan and demonstrated an understanding of the instructions.   They were advised to call back or seek an in-person evaluation if the symptoms worsen or if the condition fails to improve as anticipated.  Rae Lips, LCSW   Depression screen [facility] 2/9 01/26/2020 01/08/2020 12/29/2019 11/26/2019 11/12/2019  Decreased Interest 0 1 2 2 3   Down, Depressed, Hopeless 1 1 2 3 3   PHQ - 2 Score 1 2 4 5 6   Altered sleeping 0 1 1 2 2   Tired, decreased energy 1 3 2 3 3   Change in appetite 0 0 0 2 3  Feeling bad or failure about yourself  1 1 1 2 3   Trouble concentrating 0 0 0 1 1  Moving slowly or fidgety/restless 0 0 0 0 0  Suicidal thoughts 0 0 0 0  0  PHQ-9 Score 3 7 8 15 18   Some recent data might be hidden   GAD  7 : Generalized Anxiety Score 01/26/2020 01/08/2020 12/29/2019 11/26/2019  Nervous, Anxious, on Edge 1 3 3 3   Control/stop worrying 1 3 3 3   Worry too much - different things 1 3 2 3   Trouble relaxing 0 2 3 2   Restless 0 0 1 1  Easily annoyed or irritable 1 3 3 3   Afraid - awful might happen 0 1 1 2   Total GAD 7 Score 4 15 16  17

## 2020-01-13 NOTE — Lactation Note (Signed)
This note was copied from a baby's chart. Lactation Consultation Note  Patient Name: Patty Smith [id number] Date: 01/13/2020 Reason for consult: Follow-up assessment;Term;Infant weight loss;Other (Comment) (7 % weight loss , milk is in , resolving engorgement / baby in the tx nursery for a circ) Age:31 hours  Per mom having pinching with the latch and sore nipples.  LC offered to assess breast tissue and  Engorgement resolved, left nipple  Positional strip, right nipple clear. LC noted areola edema bilaterally and provided the breast shells while awake alternating with comfort gels x 6 days.  LC reviewed sore nipple and engorgement prevention and tx .  Mom has a hand pump, shells , comfort gels, DEBP kit.  Mom aware if she is in need of a DEBP - she has the kit that goes to the [facility] pump.  LC provided the [facility] resource brochure.   Maternal Data Has patient been taught Hand Expression?: Yes  Feeding Feeding Type:  (per mom baby fed short time prior to going for a circ)  LATCH Score ( Latch Score by IBCLC at 0620 )  Latch: Grasps breast easily, tongue down, lips flanged, rhythmical sucking.  Audible Swallowing: Spontaneous and intermittent  Type of Nipple: Everted at rest and after stimulation  Comfort (Breast/Nipple): Filling, red/small blisters or bruises, mild/mod discomfort (sore)  Hold (Positioning): Assistance needed to correctly position infant at breast and maintain latch.  LATCH Score: 8  Interventions Interventions: Breast feeding basics reviewed;Shells;Coconut oil;Comfort gels;Hand pump;DEBP  Lactation Tools Discussed/Used Tools: Shells;Comfort gels;Coconut oil;Flanges;Pump Flange Size: 24;27 Shell Type: Inverted Breast pump type: Manual;Double-Electric Breast Pump WIC Program: Yes Pump Education: Milk Storage Initiated by:: Allayne Stack RN IBCLC Date initiated:: 01/13/20   Consult Status Consult Status: Complete Date: 01/13/20 Follow-up type:  In-patient    [place] 01/13/2020, 9:33 AM

## 2020-01-13 NOTE — Lactation Note (Signed)
This note was copied from a baby's chart. Lactation Consultation Note Baby 61 hrs old. RN informed LC that mom was engorged. Mom's Rt. Breast w/knots. LC discussed pumping w/mom. Mom agreeable. Mom shown how to use DEBP & how to disassemble, clean, & reassemble parts. Mom knows to pump q3h for 15-20 min. initiated mom pumping. Collected over 30 ml. Massaged breast. Baby BF w/audible swallows noted. Baby softened breast well. Mom states much relief. Engorgement management discussed. Comfort gels given. Mom's nipples sore. Baby has been cluster feeding. Milk storage reviewed.   Patient Name: Patty Smith [id number] Date: 01/13/2020 Reason for consult: Follow-up assessment;Term;Maternal endocrine disorder Age:91 hours  Maternal Data    Feeding Feeding Type: Breast Fed  LATCH Score Latch: Grasps breast easily, tongue down, lips flanged, rhythmical sucking.  Audible Swallowing: Spontaneous and intermittent  Type of Nipple: Everted at rest and after stimulation  Comfort (Breast/Nipple): Filling, red/small blisters or bruises, mild/mod discomfort (sore)  Hold (Positioning): Assistance needed to correctly position infant at breast and maintain latch.  LATCH Score: 8  Interventions Interventions: Breast feeding basics reviewed;Adjust position;DEBP;Assisted with latch;Support pillows;Position options;Skin to skin;Breast massage;Expressed milk;Hand express;Breast compression;Reverse pressure;Comfort gels  Lactation Tools Discussed/Used Tools: Pump Breast pump type: Double-Electric Breast Pump Pump Education: Setup, frequency, and cleaning;Milk Storage Initiated by:: Peri Jefferson RN IBCLC Date initiated:: 01/13/20   Consult Status Consult Status: Follow-up Date: 01/14/20 Follow-up type: In-patient    Patty Smith 01/13/2020, 6:23 AM

## 2020-01-20 ENCOUNTER — Ambulatory Visit: Payer: Self-pay

## 2020-01-22 ENCOUNTER — Other Ambulatory Visit: Payer: Self-pay

## 2020-01-22 ENCOUNTER — Encounter: Payer: Self-pay | Admitting: *Deleted

## 2020-01-22 ENCOUNTER — Ambulatory Visit (INDEPENDENT_AMBULATORY_CARE_PROVIDER_SITE_OTHER): Payer: Medicaid Other | Admitting: *Deleted

## 2020-01-22 VITALS — BP 124/81 | HR 84 | Ht 65.0 in | Wt 225.8 lb

## 2020-01-22 DIAGNOSIS — Z4889 Encounter for other specified surgical aftercare: Secondary | ICD-10-CM

## 2020-01-22 NOTE — Progress Notes (Signed)
Pt presents for incision check and BP check following C/S on 01/10/20. BP - 141/87, P - 84.  Pt denies H/A or visual disturbances. Recheck BP - 124/81.  Pt advised to continue taking Amlodipine as prescribed. Incision assessed following removal of steri-strips for better visualization and was found to be healing well. No redness, swelling, bleeding or drainage was observed. Cleansing techniques were reviewed. Pt has PP appt w/2hr GTT on 2/21. She voiced understanding of all information and instructions given.

## 2020-01-26 ENCOUNTER — Ambulatory Visit: Payer: Self-pay | Admitting: Clinical

## 2020-01-26 DIAGNOSIS — Z658 Other specified problems related to psychosocial circumstances: Secondary | ICD-10-CM

## 2020-01-26 NOTE — Patient Instructions (Signed)
[facility] [address] [place], [place] [address] [phone] (main office) [phone] (Sahil Milner's office)   

## 2020-02-17 ENCOUNTER — Other Ambulatory Visit: Payer: Medicaid Other

## 2020-02-20 ENCOUNTER — Other Ambulatory Visit: Payer: Self-pay | Admitting: *Deleted

## 2020-02-20 DIAGNOSIS — O0990 Supervision of high risk pregnancy, unspecified, unspecified trimester: Secondary | ICD-10-CM

## 2020-02-23 ENCOUNTER — Other Ambulatory Visit: Payer: Self-pay

## 2020-02-23 ENCOUNTER — Other Ambulatory Visit: Payer: Medicaid Other

## 2020-02-23 ENCOUNTER — Encounter: Payer: Self-pay | Admitting: General Practice

## 2020-02-23 ENCOUNTER — Ambulatory Visit (INDEPENDENT_AMBULATORY_CARE_PROVIDER_SITE_OTHER): Payer: Medicaid Other | Admitting: Obstetrics and Gynecology

## 2020-02-23 ENCOUNTER — Telehealth: Payer: Self-pay | Admitting: Obstetrics and Gynecology

## 2020-02-23 ENCOUNTER — Encounter: Payer: Self-pay | Admitting: Obstetrics and Gynecology

## 2020-02-23 VITALS — BP 130/89 | HR 92 | Ht 65.0 in | Wt 220.3 lb

## 2020-02-23 DIAGNOSIS — O24439 Gestational diabetes mellitus in the puerperium, unspecified control: Secondary | ICD-10-CM

## 2020-02-23 DIAGNOSIS — Z79899 Other long term (current) drug therapy: Secondary | ICD-10-CM

## 2020-02-23 DIAGNOSIS — O165 Unspecified maternal hypertension, complicating the puerperium: Secondary | ICD-10-CM | POA: Diagnosis not present

## 2020-02-23 DIAGNOSIS — Z793 Long term (current) use of hormonal contraceptives: Secondary | ICD-10-CM

## 2020-02-23 DIAGNOSIS — O0990 Supervision of high risk pregnancy, unspecified, unspecified trimester: Secondary | ICD-10-CM

## 2020-02-23 NOTE — Telephone Encounter (Signed)
Called patient regarding coming in for BP check next week. Patient states she couldn't come to any of the times we offered her at the front desk. Patient states she can only come between 10-12 due to her breastfeeding schedule. Offered patient appt on 3/1 at 1020. Patient verbalized understanding and states she can do that. BP will need to be reviewed with Dr Vergie Living even if BP is normal.

## 2020-02-23 NOTE — Progress Notes (Signed)
    Post Partum Visit Note  Patty Smith is a 30 y.o. G62P2002 female who presents for a postpartum visit. She is s/p 1/8 scheduled rLTCS; preg c/b gdm and dx with mild pp htn on POD#2 and started on qday norvasc Anesthesia: spinal.   Postpartum course has been uncomplicated. Baby is doing well. Baby is feeding by breast. Bleeding none; she did have some bleeding when she missed two days of the POPs; she started the POPs 3wks PP. Bowel function is normal. Bladder function is normal. Patient is not sexually active. Contraception method is oral progesterone-only contraceptive. Postpartum depression screening: negative.  Pap negative 10/2017    Edinburgh Postnatal Depression Scale - 02/23/20 1120      Edinburgh Postnatal Depression Scale:  In the Past 7 Days   I have been able to laugh and see the funny side of things. 0    I have looked forward with enjoyment to things. 0    I have blamed myself unnecessarily when things went wrong. 0    I have been anxious or worried for no good reason. 2    I have felt scared or panicky for no good reason. 0    Things have been getting on top of me. 1    I have been so unhappy that I have had difficulty sleeping. 0    I have felt sad or miserable. 0    I have been so unhappy that I have been crying. 0    The thought of harming myself has occurred to me. 0    Edinburgh Postnatal Depression Scale Total 3           Review of Systems Pertinent items noted in HPI and remainder of comprehensive ROS otherwise negative.    Objective:  BP 130/89   Pulse 92   Ht 5\' 5"  (1.651 m)   Wt 220 lb 4.8 oz (99.9 kg)   BMI 36.66 kg/m    NAD Abdomen: soft, nttp, nd. C/d/i incision, no masses or hernias Assessment:    Normal postpartum exam.  Plan:  *PP: routine care. Rpt pap smear later this year. Pt with h/o combined OCP use and would like to get back on these. I d/w her recommend staying off the norvasc (pt used last pill yesterday) and doing a bp check next  week. If normal, then can transition to combined OCP. Pt amenable to plan *PP HTN: see above *GDM: pt doing 2h GTT today  RTC 1wk for bp check and to potentially start combined OCP  , MD [facility] [place] [facility], [facility]

## 2020-02-23 NOTE — Telephone Encounter (Signed)
[place] wanted to see patient in one week every Time I gave her she said she could not do it, she want a nurse or pickens to call her

## 2020-02-24 ENCOUNTER — Encounter: Payer: Self-pay | Admitting: Obstetrics and Gynecology

## 2020-02-24 DIAGNOSIS — R7303 Prediabetes: Secondary | ICD-10-CM | POA: Insufficient documentation

## 2020-02-24 LAB — GLUCOSE TOLERANCE, 2 HOURS
Glucose, 2 hour: 63 mg/dL — ABNORMAL LOW (ref 65–139)
Glucose, GTT - Fasting: 104 mg/dL — ABNORMAL HIGH (ref 65–99)

## 2020-03-02 ENCOUNTER — Ambulatory Visit: Payer: Medicaid Other

## 2020-03-02 ENCOUNTER — Telehealth: Payer: Self-pay | Admitting: *Deleted

## 2020-03-02 NOTE — Telephone Encounter (Signed)
Called pt due to missed appt today for BP check. Per chart review, she had mild BP elevation on 2/21 and she is taking OCP's. Dr. Vergie Living would like BP to be reviewed by any provider - even if normal. She stated that her baby was up all night so she was not able to keep appt today. She agreed to appt on 3/3 @ 1:30 pm.

## 2020-03-04 ENCOUNTER — Other Ambulatory Visit: Payer: Self-pay

## 2020-03-04 ENCOUNTER — Encounter: Payer: Self-pay | Admitting: Lactation Services

## 2020-03-04 ENCOUNTER — Ambulatory Visit (INDEPENDENT_AMBULATORY_CARE_PROVIDER_SITE_OTHER): Payer: Medicaid Other | Admitting: Lactation Services

## 2020-03-04 DIAGNOSIS — Z793 Long term (current) use of hormonal contraceptives: Secondary | ICD-10-CM

## 2020-03-04 DIAGNOSIS — Z0130 Encounter for examination of blood pressure without abnormal findings: Secondary | ICD-10-CM

## 2020-03-04 NOTE — Progress Notes (Signed)
Mom reports she is concerned with the amount of milk she is not able to pump for infant. He BF well and needs some supplement for feeding. She is getting some formula from [facility]. Mom reports she does not always pump when infant gets a bottle, reviewed supply and demand and enc her to pump when infant is getting a bottle. She wants to change OCP's but wants to breast feed.   Patient here for BP. She denies headache, blurred vision, or dizziness. She has been off her Norvasc.   Patient PHQ-9 is 11. She reports she has seen Asher Muir in the past, not recently. She has no plans for self harm. She feels very stressed out about not producing enough milk. Infant has had times when he is not sleeping and she is getting ready to go back to work. She has a good support system at home.   Reviewed patient with Dr. Debroah Loop. BP is wnl, OCP's will remain the same as patient is breast feeding, and PHQ-9 evaluated, will refer to [facility].

## 2020-03-08 NOTE — Progress Notes (Signed)
Patient was assessed and managed by nursing staff during this encounter. I have reviewed the chart and agree with the documentation and plan. I have also made any necessary editorial changes.  [place], MD 03/08/2020 8:28 AM

## 2020-03-10 NOTE — BH Specialist Note (Signed)
Pt did not arrive to video visit and did not answer the phone; Left HIPPA-compliant message to call back Imanol Bihl from [facility] at  [phone] (Macky Galik's office).  ?; left MyChart message for patient.  ? ?

## 2020-03-24 ENCOUNTER — Ambulatory Visit: Payer: Medicaid Other | Admitting: Clinical

## 2020-03-24 DIAGNOSIS — Z91199 Patient's noncompliance with other medical treatment and regimen due to unspecified reason: Secondary | ICD-10-CM

## 2020-03-24 DIAGNOSIS — Z5329 Procedure and treatment not carried out because of patient's decision for other reasons: Secondary | ICD-10-CM

## 2020-04-07 IMAGING — DX DG CHEST 2V
2 series · 2 of 2 positions shown · non-contrast
Comparison: Chest radiograph 12/26/2015

CLINICAL DATA: Chest pain.

EXAM:
CHEST - 2 VIEW

[x chest ap]
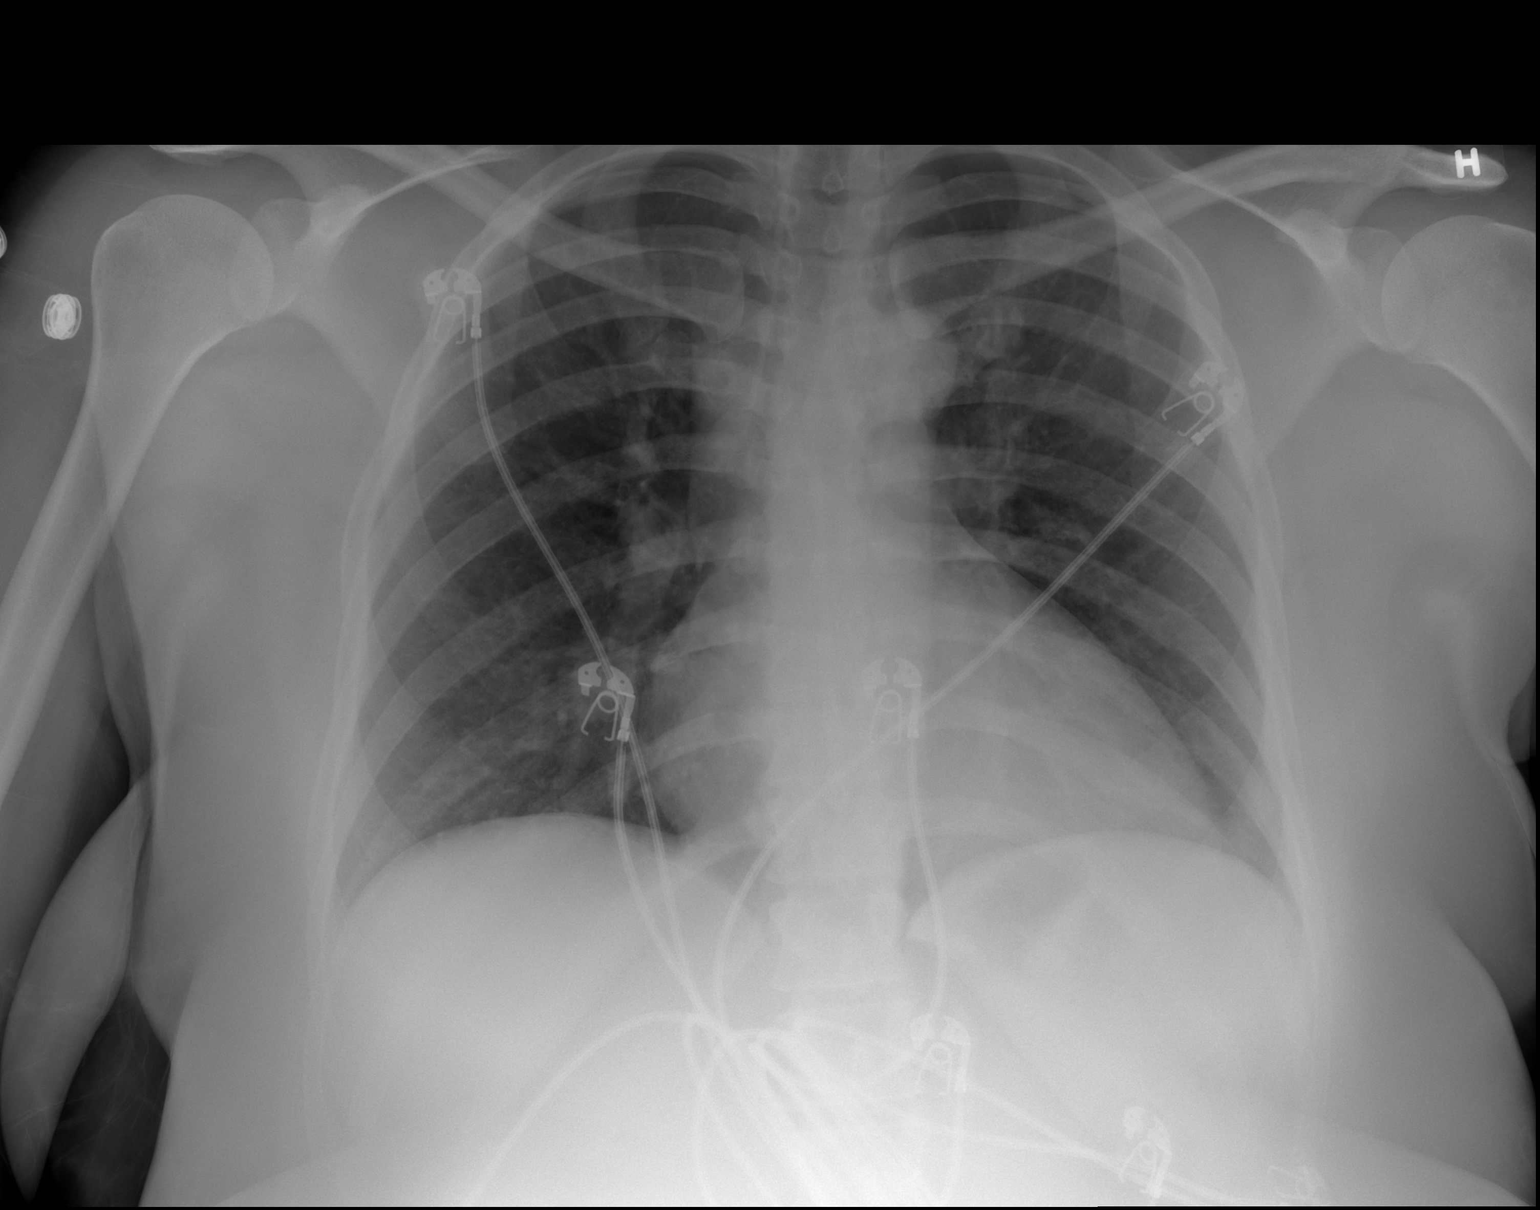

[w chest lat]
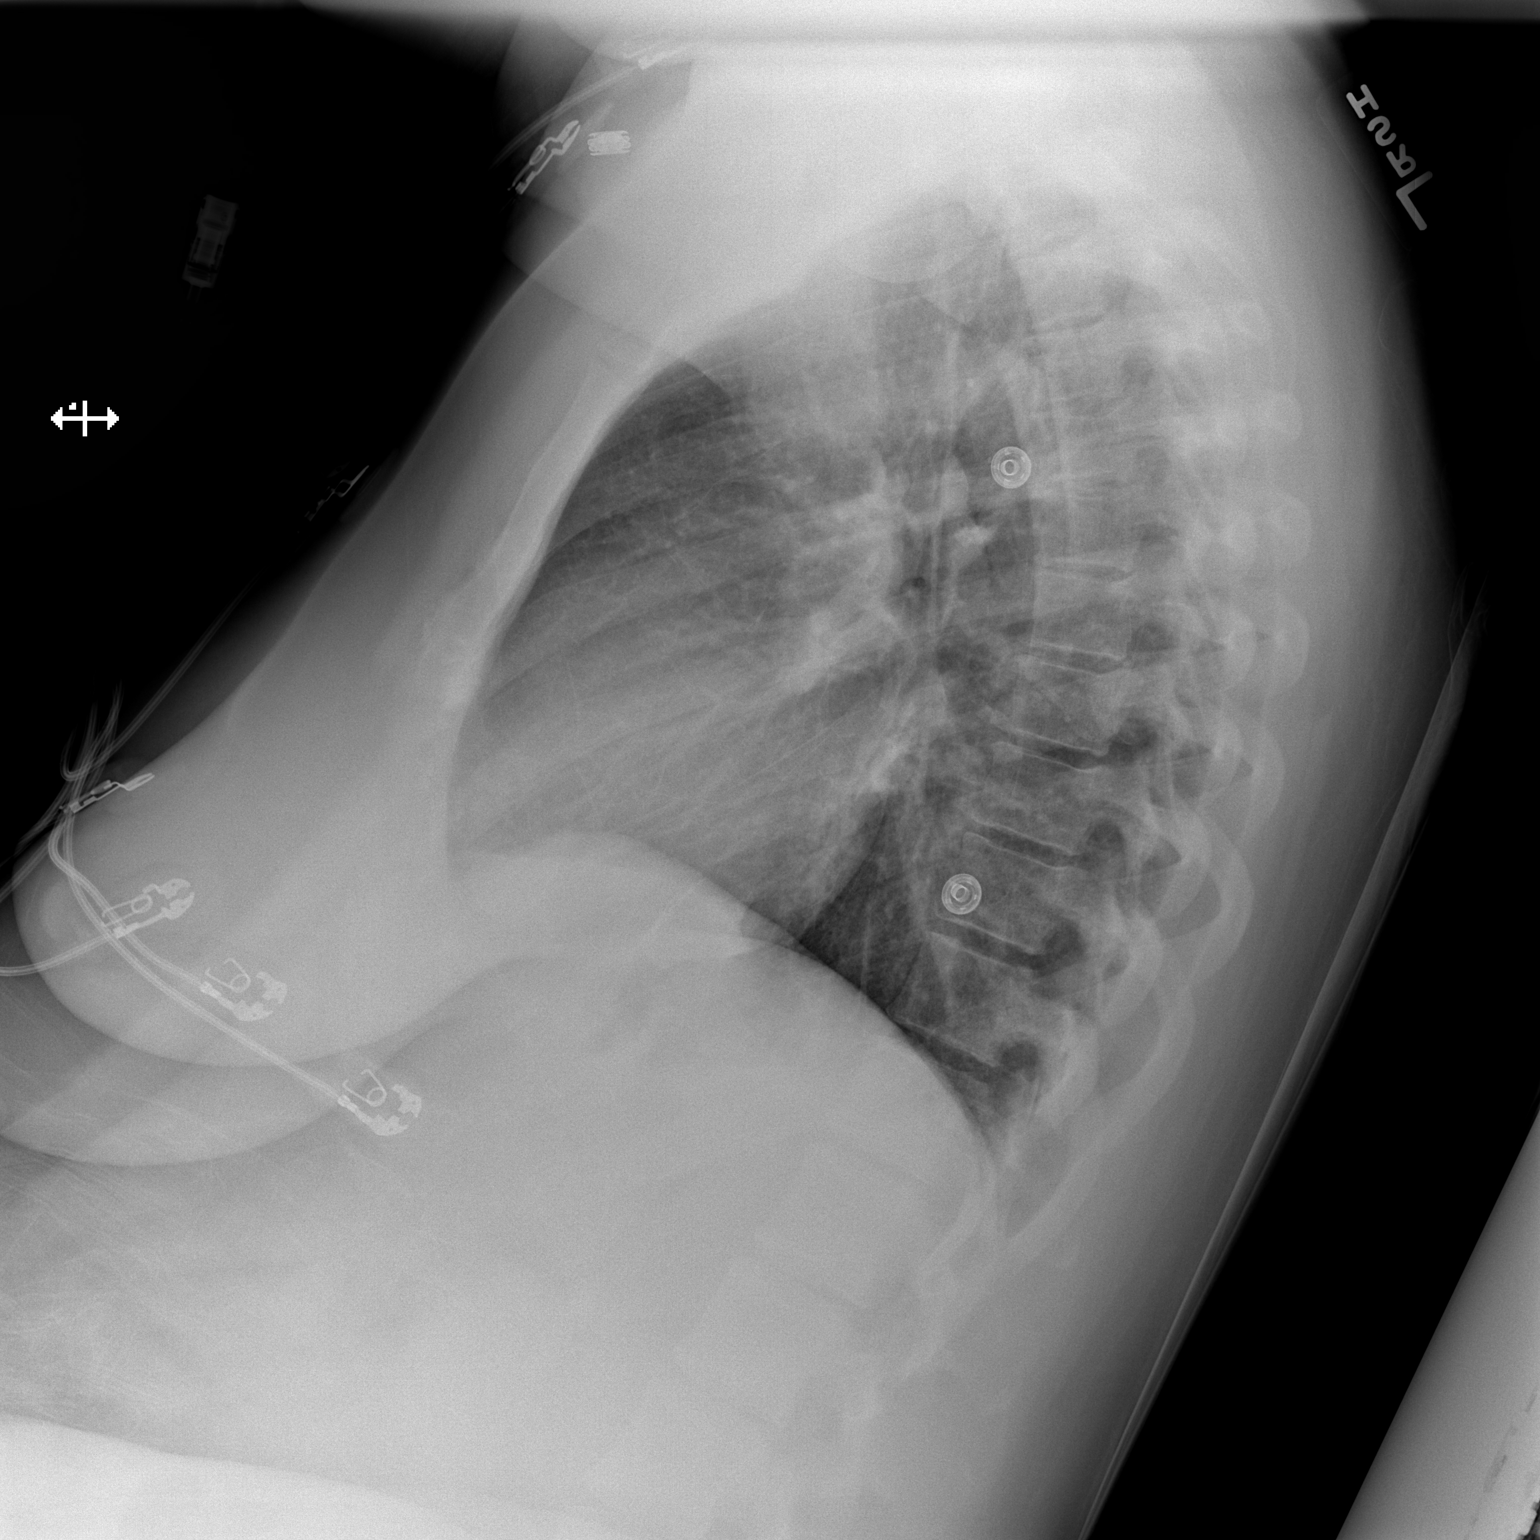

[2 of 2 positions shown; findings below may reference images not displayed]

FINDINGS: Heart size within normal limits.

There is no airspace consolidation within the lungs.

No evidence of pleural effusion or pneumothorax.

No acute bony abnormality.

Overlying cardiac monitoring leads.
IMPRESSION: No evidence of acute cardiopulmonary abnormality.

## 2020-04-07 IMAGING — US US ABDOMEN LIMITED
1 series · 14 of 22 positions shown · non-contrast
Comparison: None.

CLINICAL DATA: Right upper quadrant pain with nausea and vomiting

EXAM:
ULTRASOUND ABDOMEN LIMITED RIGHT UPPER QUADRANT

[Series 1: us abdomen limited · 14 of 22 slices shown]
[im 1/22]
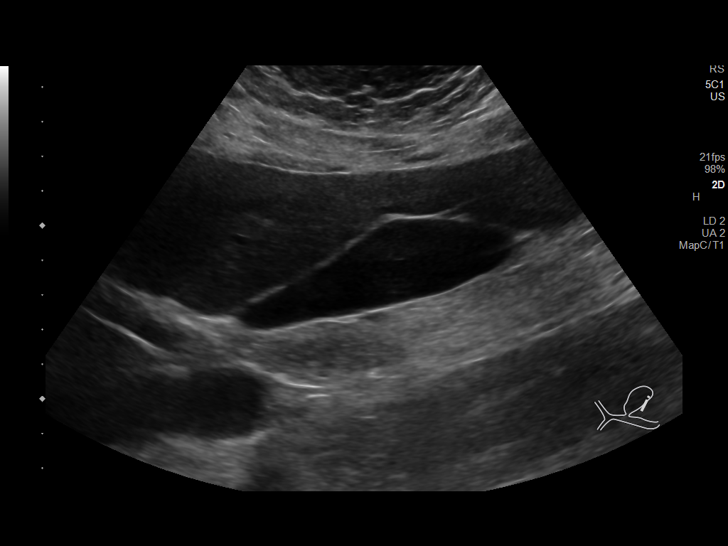
[im 3/22]
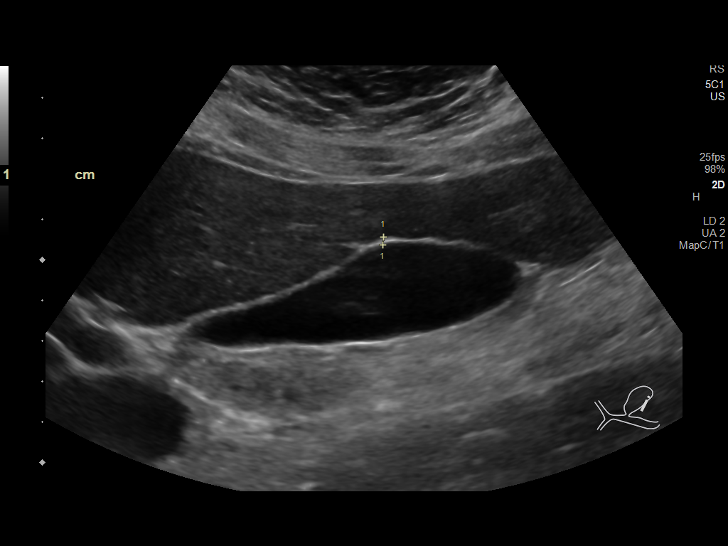
[im 4/22]
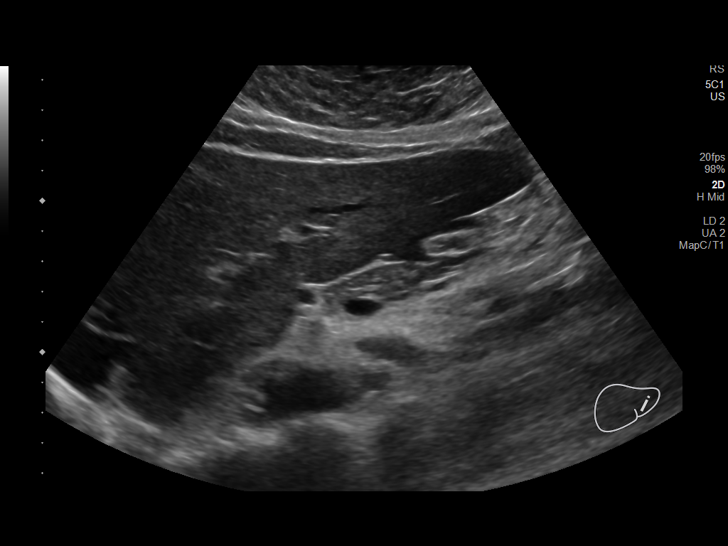
[im 6/22]
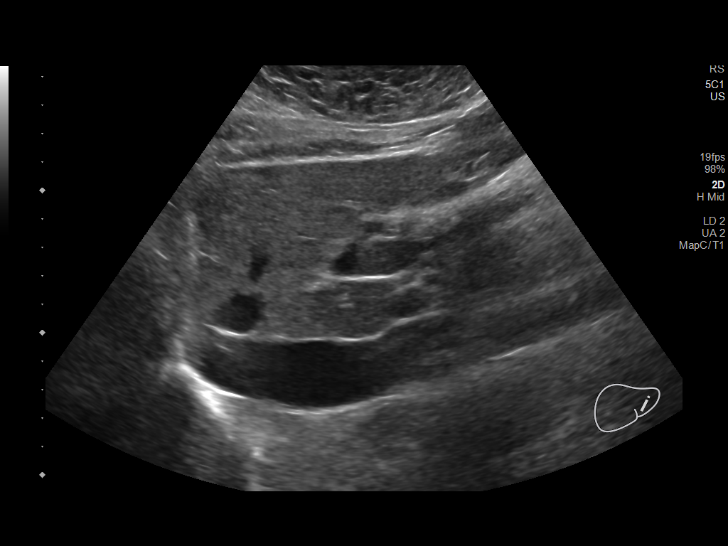
[im 8/22]
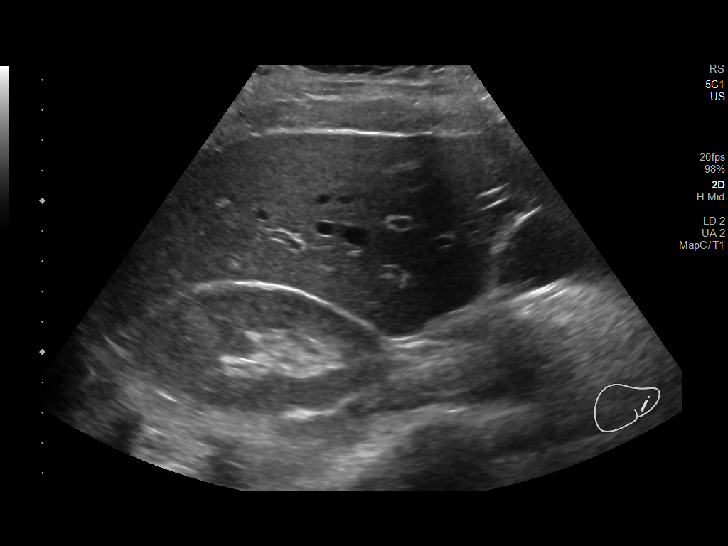
[im 9/22]
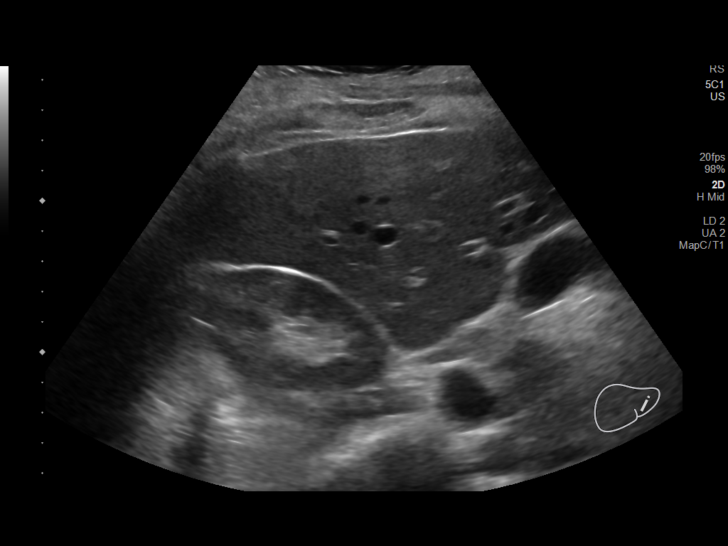
[im 11/22]
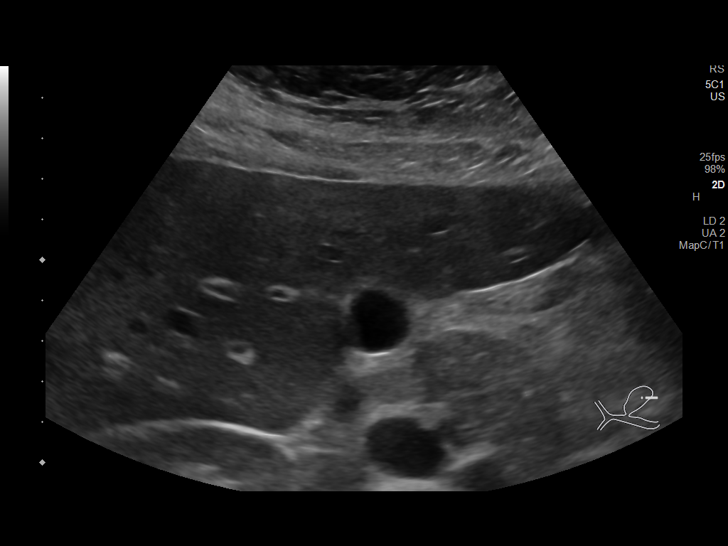
[im 12/22]
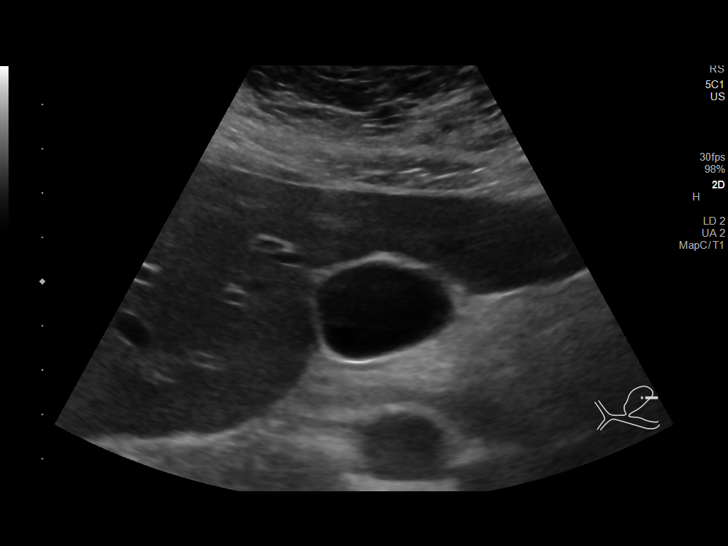
[im 14/22]
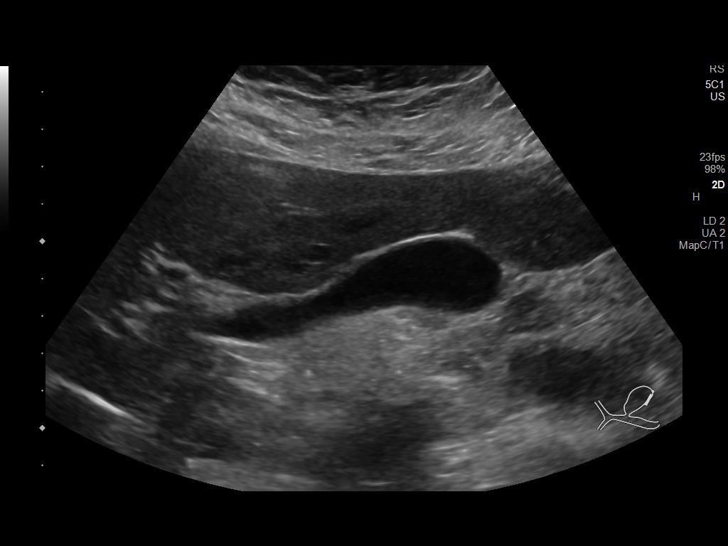
[im 15/22]
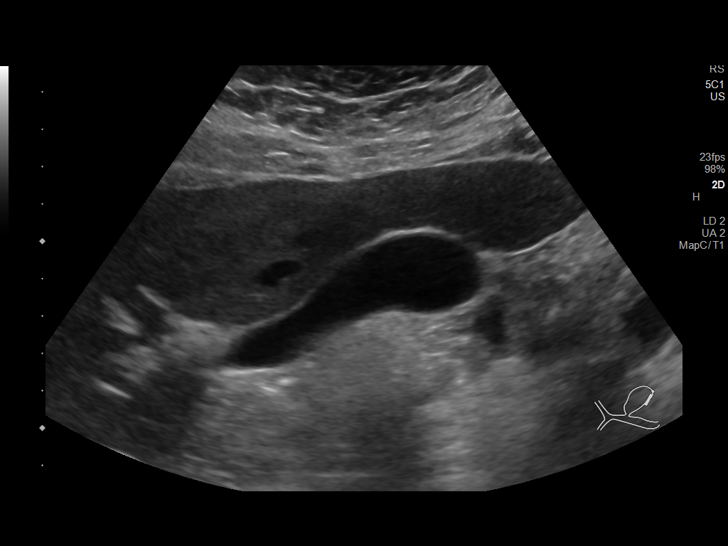
[im 17/22]
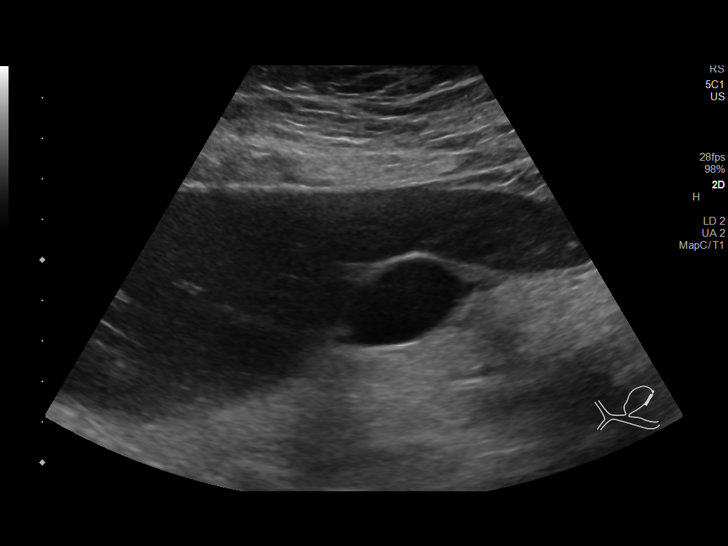
[im 19/22]
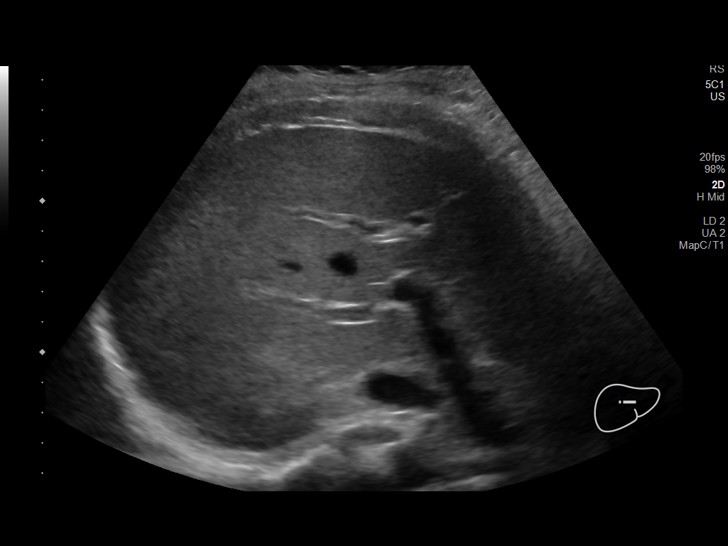
[im 20/22]
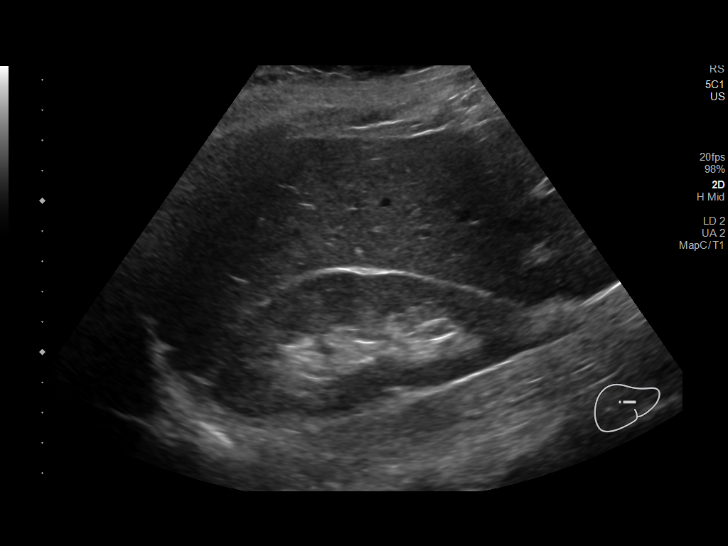
[im 22/22]
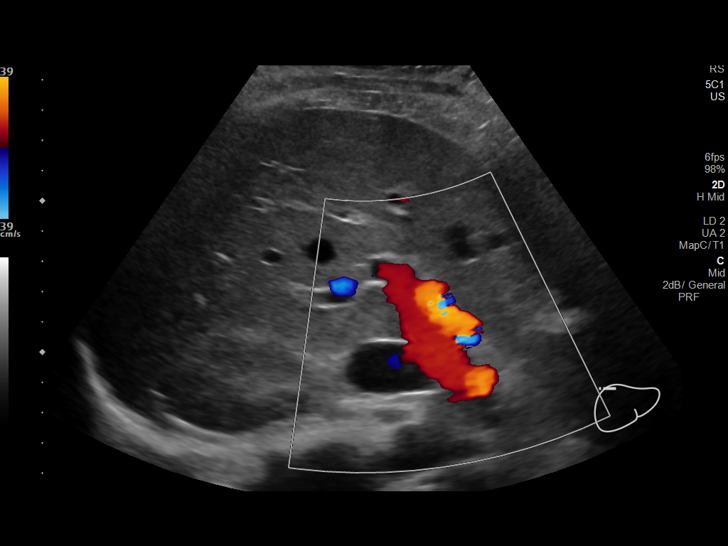

[14 of 22 positions shown; findings below may reference images not displayed]

FINDINGS: Gallbladder:

No gallstones or wall thickening visualized. There is no
pericholecystic fluid. No sonographic Murphy sign noted by
sonographer.

Common bile duct:

Diameter: 3 mm. No intrahepatic or extrahepatic biliary duct
dilatation. Portions of common bile duct not visualized due to
patient request for exam to terminate.

Liver:

No focal lesion identified. Within normal limits in parenchymal
echogenicity. Portal vein is patent on color Doppler imaging with
normal direction of blood flow towards the liver.

Other: None.
IMPRESSION: Study within normal limits. Note that the common bile duct is not
seen in its entirety as patient requested termination of examination
prior to complete interrogation.

## 2020-12-28 ENCOUNTER — Encounter (HOSPITAL_COMMUNITY): Payer: Self-pay | Admitting: Emergency Medicine

## 2020-12-28 ENCOUNTER — Ambulatory Visit (HOSPITAL_COMMUNITY)
Admission: EM | Admit: 2020-12-28 | Discharge: 2020-12-28 | Disposition: A | Discharge status: Home / Self Care | Payer: Medicaid Other | Attending: Physician Assistant | Admitting: Physician Assistant

## 2020-12-28 DIAGNOSIS — L0501 Pilonidal cyst with abscess: Secondary | ICD-10-CM | POA: Diagnosis not present

## 2020-12-28 MED ORDER — MUPIROCIN 2 % EX OINT: 1.0000 "application " | TOPICAL_OINTMENT | Freq: Every day | CUTANEOUS | 0 refills | Status: AC

## 2020-12-28 MED ORDER — DOXYCYCLINE HYCLATE 100 MG PO CAPS: 100.0000 mg | ORAL_CAPSULE | Freq: Two times a day (BID) | ORAL | 0 refills | Status: AC

## 2020-12-28 NOTE — ED Provider Notes (Signed)
[facility]    CSN: [id number] Arrival date & time: 12/28/20  1806      History   Chief Complaint Chief Complaint  Patient presents with   Abscess    HPI Patty Smith is a 30 y.o. female.   Patient presents today with a weeklong history of worsening pain and lesion in her gluteal cleft.  Reports that she has a history of pilonidal cyst with similar symptoms.  Last episode was approximately 7 years ago.  She reports pain is severe and rated 10 on a 0-10 pain scale, localized to affected area, described as sharp, worse with prolonged sitting or certain movements, no alleviating factors identified.  She denies any fever, chest pain, shortness of breath, nausea, vomiting.  Denies any recent antibiotic use.  She is not breast-feeding or currently pregnant.  She has not seen a surgeon for this condition in the past.  She is unable to perform daily activities due to severity of pain.   Past Medical History:  Diagnosis Date   Alpha thalassemia silent carrier 08/27/2019   Anxiety    Complication of anesthesia    epidural /or spinal didin't work- stuck many times   COVID-19 09/17/2019   Depression    doing ok   Endometriosis    not officially diagnosed by surgery or [place] but prior MD treated with ocp's    Family history of adverse reaction to anesthesia    multiple health issues that impact anesthesia   Gestational diabetes    History of C-section    Consent 10/29/19; Says that she never dilated past 5 cm, cervix swelled and her "pelvis was fractured" Op note in media tab, documented not getting past 4.5cm, had LTCS with some "inferolateral extensions"   Infection    UTI   Scarlet fever 2001   Viral meningitis     Patient Active Problem List   Diagnosis Date Noted   Prediabetes 02/24/2020   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 Authorizing Provider  doxycycline (VIBRAMYCIN) 100 MG capsule Take 1 capsule (100 mg total) by mouth 2 (two) times daily. 12/28/20  Yes Raymund Manrique, Denny Peon K, PA-C  mupirocin ointment (BACTROBAN) 2 % Apply 1 application topically daily. 12/28/20  Yes Abdinasir Spadafore K, PA-C  acetaminophen (TYLENOL) 500 MG tablet TAKE 2 TABLETS (1,000 MG TOTAL) BY MOUTH EVERY SIX HOURS. Patient not taking: Reported on 03/04/2020 01/13/20 01/12/21  Cresenzo-Dishmon, Scarlette Calico, CNM  amLODipine (NORVASC) 5 MG tablet Take 1 tablet (5 mg total) by mouth daily. Patient not taking: Reported on 03/04/2020 01/13/20  Cresenzo-Dishmon, Scarlette Calico, CNM  ibuprofen (ADVIL) 800 MG tablet TAKE 1 TABLET (800 MG TOTAL) BY MOUTH EVERY SIX HOURS. Patient not taking: Reported on 03/04/2020 01/13/20 01/12/21  Cresenzo-Dishmon, Scarlette Calico, CNM  norethindrone (ORTHO MICRONOR) 0.35 MG tablet Take 1 tablet (0.35 mg total) by mouth daily. Start taking the Sunday after the baby turns 45 weeks old 01/13/20   Cresenzo-Dishmon, Scarlette Calico, CNM  Prenatal Vit-Fe Fumarate-FA (PRENATAL VITAMIN PO) Take 1 tablet by mouth daily.    [provider]    Family History Family History  Problem Relation Age of Onset   Heart failure Father    Hypertension Father    Diabetes Father    Anxiety disorder Father    Endometriosis Mother    Depression Mother    COPD Mother    Anxiety disorder Mother     Social History Social History   Tobacco Use   Smoking status: Former    Packs/day: 0.30     Types: Cigarettes    Quit date: 05/05/2019    Years since quitting: 1.6   Smokeless tobacco: Never  Vaping Use   Vaping Use: Never used  Substance Use Topics   Alcohol use: Not Currently    Comment: Occ   Drug use: Not Currently    Types: Marijuana    Comment: last use was 05/05/2019     Allergies   Elemental sulfur   Review of Systems Review of Systems  Constitutional:  Positive for activity change. Negative for appetite change, fatigue and fever.  Respiratory:  Negative for cough and shortness of breath.   Cardiovascular:  Negative for chest pain.  Gastrointestinal:  Negative for abdominal pain, diarrhea, nausea and vomiting.  Musculoskeletal:  Negative for arthralgias and myalgias.  Neurological:  Negative for dizziness, light-headedness and headaches.    Physical Exam Triage Vital Signs ED Triage Vitals  Enc Vitals Group     BP 12/28/20 1910 (!) 151/107     Pulse Rate 12/28/20 1910 (!) 108     Resp 12/28/20 1910 20     Temp 12/28/20 1910 98.7 F (37.1 C)     Temp Source 12/28/20 1910 Oral     SpO2 12/28/20 1910 98 %     Weight --      Height --      Head Circumference --      Peak Flow --      Pain Score 12/28/20 1908 10     Pain Loc --      Pain Edu? --      Excl. in GC? --    No data found.  Updated Vital Signs BP (!) 151/107 (BP Location: Left Arm)    Pulse (!) 108    Temp 98.7 F (37.1 C) (Oral)    Resp 20    LMP 11/29/2020    SpO2 98%   Visual Acuity Right Eye Distance:   Left Eye Distance:   Bilateral Distance:    Right Eye Near:   Left Eye Near:    Bilateral Near:     Physical Exam Vitals reviewed.  Constitutional:      General: She is awake. She is not in acute distress.    Appearance: Normal appearance. She is well-developed. She is not ill-appearing.     Comments: Very pleasant female appears stated age in no acute distress standing in exam room in obvious discomfort  HENT:     Head: Normocephalic and atraumatic.  Cardiovascular:      Rate and Rhythm: Normal rate  and regular rhythm.     Heart sounds: Normal heart sounds, S1 normal and S2 normal. No murmur heard. Pulmonary:     Effort: Pulmonary effort is normal.     Breath sounds: Normal breath sounds. No wheezing, rhonchi or rales.     Comments: Clear to auscultation bilaterally Skin:    Findings: Abscess present.     Comments: 4 cm x 2 cm abscess noted gluteal cleft with central induration.  No streaking or evidence of lymphangitis.  Area is tender to palpation and warm to touch.  No active bleeding or drainage noted.  Psychiatric:        Behavior: Behavior is cooperative.     UC Treatments / Results  Labs (all labs ordered are listed, but only abnormal results are displayed) Labs Reviewed - No data to display  EKG   Radiology No results found.  Procedures Incision and Drainage  Date/Time: 12/28/2020 7:50 PM Performed by: Jeani Hawking, PA-C Authorized by: Jeani Hawking, PA-C   Consent:    Consent obtained:  Verbal   Consent given by:  Patient   Risks, benefits, and alternatives were discussed: yes     Risks discussed:  Bleeding, incomplete drainage, infection and pain   Alternatives discussed:  No treatment, alternative treatment and referral Universal protocol:    Procedure explained and questions answered to patient or proxy's satisfaction: yes     Patient identity confirmed:  Verbally with patient Location:    Type:  Pilonidal cyst   Size:  4cm x 2 cm   Location:  Anogenital   Anogenital location:  Pilonidal Pre-procedure details:    Skin preparation:  Chlorhexidine with alcohol Sedation:    Sedation type:  None Anesthesia:    Anesthesia method:  Local infiltration   Local anesthetic:  Lidocaine 1% WITH epi Procedure type:    Complexity:  Complex Procedure details:    Ultrasound guidance: no     Needle aspiration: no     Incision types:  Single straight   Incision depth:  Dermal   Wound management:  Probed and deloculated and  irrigated with saline   Drainage:  Bloody and purulent   Drainage amount:  Moderate   Wound treatment:  Wound left open   Packing materials:  None Post-procedure details:    Procedure completion:  Tolerated (including critical care time)  Medications Ordered in UC Medications - No data to display  Initial Impression / Assessment and Plan / UC Course  I have reviewed the triage vital signs and the nursing notes.  Pertinent labs & imaging results that were available during my care of the patient were reviewed by me and considered in my medical decision making (see chart for details).     I&D performed in clinic with significant improvement of symptoms.  Patient was started on doxycycline 100 mg twice daily for 10 days.  Discussed that symptoms will likely recur until she sees a Careers adviser and was given contact information for a local surgeon once symptoms improved to consider removal.  Discussed wound care and she was provided Bactroban to be used with dressing changes.  She is to alternate Tylenol and ibuprofen for pain relief.  Discussed alarm symptoms that warrant emergent evaluation including increased pain, fever, nausea, vomiting.  Recommended follow-up with our clinic or PCP within a few days to ensure appropriate healing.  Strict return precautions given to which she expressed understanding.  Final Clinical Impressions(s) / UC Diagnoses   Final diagnoses:  Pilonidal abscess  Discharge Instructions      We drained your abscess.  Please start doxycycline 100 mg twice daily.  Keep area clean and covered.  Apply Bactroban ointment with dressing changes.  Use Tylenol and ibuprofen for pain.  If you develop any worsening symptoms including increased swelling, pain, fever, nausea, vomiting you need to go to the emergency room.  I recommend following up with either our clinic or your primary care provider within a few days to ensure appropriate healing.     ED Prescriptions      Medication Sig Dispense Auth. Provider   doxycycline (VIBRAMYCIN) 100 MG capsule Take 1 capsule (100 mg total) by mouth 2 (two) times daily. 20 capsule Audra Bellard K, PA-C   mupirocin ointment (BACTROBAN) 2 % Apply 1 application topically daily. 22 g Xaidyn Kepner K, PA-C      PDMP not reviewed this encounter.   Jeani Hawking, PA-C 12/28/20 1951

## 2020-12-28 NOTE — Discharge Instructions (Signed)
We drained your abscess.  Please start doxycycline 100 mg twice daily.  Keep area clean and covered.  Apply Bactroban ointment with dressing changes.  Use Tylenol and ibuprofen for pain.  If you develop any worsening symptoms including increased swelling, pain, fever, nausea, vomiting you need to go to the emergency room.  I recommend following up with either our clinic or your primary care provider within a few days to ensure appropriate healing.

## 2020-12-28 NOTE — ED Triage Notes (Signed)
Pt reports had recurrent cyst in crease of buttocks a couple years ago. Reports is back the past couple days.  Denies drainage.

## 2022-02-24 ENCOUNTER — Other Ambulatory Visit: Payer: Self-pay

## 2022-02-24 ENCOUNTER — Emergency Department (HOSPITAL_COMMUNITY)
Admission: EM | Admit: 2022-02-24 | Discharge: 2022-02-25 | Disposition: A | Discharge status: Home / Self Care | Payer: Medicaid Other | Attending: Emergency Medicine | Admitting: Emergency Medicine

## 2022-02-24 DIAGNOSIS — K529 Noninfective gastroenteritis and colitis, unspecified: Secondary | ICD-10-CM

## 2022-02-24 DIAGNOSIS — R112 Nausea with vomiting, unspecified: Secondary | ICD-10-CM | POA: Diagnosis present

## 2022-02-24 LAB — CBC WITH DIFFERENTIAL/PLATELET
Abs Immature Granulocytes: 0.03 10*3/uL (ref 0.00–0.07)
Basophils Absolute: 0 10*3/uL (ref 0.0–0.1)
Basophils Relative: 0 %
Eosinophils Absolute: 0 10*3/uL (ref 0.0–0.5)
Eosinophils Relative: 0 %
HCT: 45.4 % (ref 36.0–46.0)
Hemoglobin: 14.2 g/dL (ref 12.0–15.0)
Immature Granulocytes: 0 %
Lymphocytes Relative: 3 %
Lymphs Abs: 0.3 10*3/uL — ABNORMAL LOW (ref 0.7–4.0)
MCH: 26.8 pg (ref 26.0–34.0)
MCHC: 31.3 g/dL (ref 30.0–36.0)
MCV: 85.7 fL (ref 80.0–100.0)
Monocytes Absolute: 0.2 10*3/uL (ref 0.1–1.0)
Monocytes Relative: 2 %
Neutro Abs: 8.8 10*3/uL — ABNORMAL HIGH (ref 1.7–7.7)
Neutrophils Relative %: 95 %
Platelets: 223 10*3/uL (ref 150–400)
RBC: 5.3 MIL/uL — ABNORMAL HIGH (ref 3.87–5.11)
RDW: 13.4 % (ref 11.5–15.5)
WBC: 9.3 10*3/uL (ref 4.0–10.5)
nRBC: 0 % (ref 0.0–0.2)

## 2022-02-24 LAB — COMPREHENSIVE METABOLIC PANEL
ALT: 15 U/L (ref 0–44)
AST: 22 U/L (ref 15–41)
Albumin: 3.9 g/dL (ref 3.5–5.0)
Alkaline Phosphatase: 45 U/L (ref 38–126)
Anion gap: 11 (ref 5–15)
BUN: 14 mg/dL (ref 6–20)
CO2: 21 mmol/L — ABNORMAL LOW (ref 22–32)
Calcium: 9.3 mg/dL (ref 8.9–10.3)
Chloride: 105 mmol/L (ref 98–111)
Creatinine, Ser: 0.75 mg/dL (ref 0.44–1.00)
GFR, Estimated: >60 mL/min (ref >60)
Glucose, Bld: 163 mg/dL — ABNORMAL HIGH (ref 70–99)
Potassium: 3.9 mmol/L (ref 3.5–5.1)
Sodium: 137 mmol/L (ref 135–145)
Total Bilirubin: 1.4 mg/dL — ABNORMAL HIGH (ref 0.3–1.2)
Total Protein: 7.5 g/dL (ref 6.5–8.1)

## 2022-02-24 LAB — I-STAT BETA HCG BLOOD, ED (MC, WL, AP ONLY): I-stat hCG, quantitative: <5 m[IU]/mL (ref <5)

## 2022-02-24 LAB — LIPASE, BLOOD: Lipase: 25 U/L (ref 11–51)

## 2022-02-24 MED ORDER — ONDANSETRON HCL 4 MG/2ML IJ SOLN
4.0000 mg | Freq: Once | INTRAMUSCULAR | Status: AC
  Administered 2022-02-24: 4 mg via INTRAVENOUS
  Filled 2022-02-24: qty 2

## 2022-02-24 MED ORDER — SODIUM CHLORIDE 0.9 % IV BOLUS
1000.0000 mL | Freq: Once | INTRAVENOUS | Status: AC
  Administered 2022-02-24: 1000 mL via INTRAVENOUS

## 2022-02-24 MED ORDER — SODIUM CHLORIDE 0.9 % IV SOLN
12.5000 mg | Freq: Once | INTRAVENOUS | Status: AC
  Administered 2022-02-25: 12.5 mg via INTRAVENOUS
  Filled 2022-02-24: qty 12.5

## 2022-02-24 MED ORDER — PANTOPRAZOLE SODIUM 40 MG IV SOLR
40.0000 mg | Freq: Once | INTRAVENOUS | Status: AC
  Administered 2022-02-24: 40 mg via INTRAVENOUS
  Filled 2022-02-24: qty 10

## 2022-02-24 NOTE — ED Provider Notes (Signed)
[facility] Provider Note   CSN: [id number] Arrival date & time: 02/24/22  2159     History {Add pertinent medical, surgical, social history, OB history to HPI:1} Chief Complaint  Patient presents with   Emesis    Patty Smith is a 32 y.o. female.  32 year old female presents to the emergency department for evaluation of nausea, vomiting, diarrhea.  Symptoms began around 1300 today shortly after eating at [facility].  She has had approximately 30 episodes of nonbloody emesis.  States that she is only vomiting up stomach acid at this point.  Diarrhea has been watery, nonbloody.  She has not taken any medications for her symptoms.  Notes associated epigastric abdominal discomfort which is fairly constant.  No fever or urinary symptoms.  Patient presently on her menstrual cycle.  She has an abdominal surgical history of C-section x 2.  The history is provided by the patient. No language interpreter was used.  Emesis      Home Medications Prior to Admission medications   Medication Sig Start Date End Date Taking? Authorizing Provider  amLODipine (NORVASC) 5 MG tablet Take 1 tablet (5 mg total) by mouth daily. Patient not taking: Reported on 03/04/2020 01/13/20   Cresenzo-Dishmon, Joaquim Lai, CNM  doxycycline (VIBRAMYCIN) 100 MG capsule Take 1 capsule (100 mg total) by mouth 2 (two) times daily. 12/28/20   Raspet, Derry Skill, PA-C  mupirocin ointment (BACTROBAN) 2 % Apply 1 application topically daily. 12/28/20   Raspet, Derry Skill, PA-C  norethindrone (ORTHO MICRONOR) 0.35 MG tablet Take 1 tablet (0.35 mg total) by mouth daily. Start taking the Sunday after the baby turns 11 weeks old 01/13/20   Cresenzo-Dishmon, Joaquim Lai, CNM  Prenatal Vit-Fe Fumarate-FA (PRENATAL VITAMIN PO) Take 1 tablet by mouth daily.    [provider]      Allergies    Elemental sulfur    Review of Systems   Review of Systems  Gastrointestinal:  Positive for vomiting.   Ten systems reviewed and are negative for acute change, except as noted in the HPI.    Physical Exam Updated Vital Signs BP (!) 136/94   Pulse 78   Temp 99 F (37.2 C) (Oral)   Resp (!) 24   SpO2 100%   Physical Exam Vitals and nursing note reviewed.  Constitutional:      General: She is not in acute distress.    Appearance: She is well-developed. She is not diaphoretic.     Comments: Nontoxic appearing and in NAD  HENT:     Head: Normocephalic and atraumatic.     Mouth/Throat:     Comments: Mildly dry mm Eyes:     General: No scleral icterus.    Conjunctiva/sclera: Conjunctivae normal.  Cardiovascular:     Rate and Rhythm: Normal rate and regular rhythm.     Pulses: Normal pulses.  Pulmonary:     Effort: Pulmonary effort is normal. No respiratory distress.     Breath sounds: No stridor.     Comments: Respirations even and unlabored Abdominal:     Comments: Nondistended, obese abdomen.  Musculoskeletal:        General: Normal range of motion.     Cervical back: Normal range of motion.  Skin:    General: Skin is warm and dry.     Coloration: Skin is not pale.     Findings: No erythema or rash.  Neurological:     Mental Status: She is alert and oriented to  person, place, and time.     Coordination: Coordination normal.  Psychiatric:        Speech: Speech is rapid and pressured.        Behavior: Behavior is agitated.     ED Results / Procedures / Treatments   Labs (all labs ordered are listed, but only abnormal results are displayed) Labs Reviewed  RAPID URINE DRUG SCREEN, HOSP PERFORMED  URINALYSIS, ROUTINE W REFLEX MICROSCOPIC  CBC WITH DIFFERENTIAL/PLATELET  COMPREHENSIVE METABOLIC PANEL  LIPASE, BLOOD  I-STAT BETA HCG BLOOD, ED ([facility], [facility], [facility] ONLY)    EKG None  Radiology No results found.  Procedures Procedures  {Document cardiac monitor, telemetry assessment procedure when appropriate:1}  Medications Ordered in ED Medications  ondansetron  (ZOFRAN) injection 4 mg (has no administration in time range)  sodium chloride 0.9 % bolus 1,000 mL (has no administration in time range)  pantoprazole (PROTONIX) injection 40 mg (has no administration in time range)    ED Course/ Medical Decision Making/ A&P   {   Click here for ABCD2, HEART and other calculatorsREFRESH Note before signing :1}                          Medical Decision Making Amount and/or Complexity of Data Reviewed Labs: ordered.  Risk Prescription drug management.   ***  {Document critical care time when appropriate:1} {Document review of labs and clinical decision tools ie heart score, Chads2Vasc2 etc:1}  {Document your independent review of radiology images, and any outside records:1} {Document your discussion with family members, caretakers, and with consultants:1} {Document social determinants of health affecting pt's care:1} {Document your decision making why or why not admission, treatments were needed:1} Final Clinical Impression(s) / ED Diagnoses Final diagnoses:  None    Rx / DC Orders ED Discharge Orders     None

## 2022-02-24 NOTE — ED Triage Notes (Signed)
Pt with n/v/d since 1pm today. Feels that she may have food poisoning from eating at [facility].  Hyperventilating at time of triage and reporting tingling in extremities and chest pain. No fever or chills.

## 2022-02-25 ENCOUNTER — Encounter (HOSPITAL_COMMUNITY): Payer: Self-pay

## 2022-02-25 LAB — URINALYSIS, ROUTINE W REFLEX MICROSCOPIC
Bacteria, UA: NONE SEEN
Bilirubin Urine: NEGATIVE
Glucose, UA: NEGATIVE mg/dL
Ketones, ur: NEGATIVE mg/dL
Leukocytes,Ua: NEGATIVE
Nitrite: NEGATIVE
Protein, ur: 100 mg/dL — AB
RBC / HPF: >50 RBC/hpf (ref 0–5)
Specific Gravity, Urine: 1.026 (ref 1.005–1.030)
pH: 6 (ref 5.0–8.0)

## 2022-02-25 LAB — RAPID URINE DRUG SCREEN, HOSP PERFORMED
Amphetamines: NOT DETECTED
Barbiturates: NOT DETECTED
Benzodiazepines: NOT DETECTED
Cocaine: NOT DETECTED
Opiates: NOT DETECTED
Tetrahydrocannabinol: POSITIVE — AB

## 2022-02-25 MED ORDER — KETOROLAC TROMETHAMINE 15 MG/ML IJ SOLN
15.0000 mg | Freq: Once | INTRAMUSCULAR | Status: DC
  Filled 2022-02-25: qty 1

## 2022-02-25 MED ORDER — METOCLOPRAMIDE HCL 5 MG/ML IJ SOLN
10.0000 mg | INTRAMUSCULAR | Status: AC
  Administered 2022-02-25: 10 mg via INTRAVENOUS
  Filled 2022-02-25: qty 2

## 2022-02-25 MED ORDER — PROMETHAZINE HCL 25 MG PO TABS: 25.0000 mg | ORAL_TABLET | Freq: Four times a day (QID) | ORAL | 0 refills | Status: AC | PRN

## 2022-02-25 NOTE — Discharge Instructions (Signed)
Avoid fried foods, fatty foods, greasy foods, and milk products until symptoms resolve. Drink plenty of clear liquids to prevent dehydration. We recommend the use of Phenergan as prescribed for nausea/vomiting. Follow-up with a primary care doctor to ensure resolution of symptoms.

## 2022-02-25 NOTE — ED Notes (Signed)
Pt. States that she is still nauseous, PA notified,  Patient was asleep when entering the room.

## 2022-02-27 NOTE — BH Specialist Note (Deleted)
Integrated Behavioral Health via Telemedicine Visit  02/27/2022 Roseanne Bigwood [id number]  Number of Integrated Behavioral Health Clinician visits: No data recorded Session Start time: No data recorded  Session End time: No data recorded Total time in minutes: No data recorded  Referring Provider: Emeterio Reeve, MD Patient/Family location: Home*** [facility] Provider location: [facility] [facility]  All persons participating in visit: Patient Patty Smith and Patty Smith ***  Types of Service: {CHL AMB TYPE OF SERVICE:[phone]}  I connected with Patty Smith and/or Patty Smith's {family members:20773} via  Telephone or Video Enabled Telemedicine Application  (Video is Caregility application) and verified that I am speaking with the correct person using two identifiers. Discussed confidentiality: Yes   I discussed the limitations of telemedicine and the availability of in person appointments.  Discussed there is a possibility of technology failure and discussed alternative modes of communication if that failure occurs.  I discussed that engaging in this telemedicine visit, they consent to the provision of behavioral healthcare and the services will be billed under their insurance.  Patient and/or legal guardian expressed understanding and consented to Telemedicine visit: Yes   Presenting Concerns: Patient and/or family reports the following symptoms/concerns: *** Duration of problem: ***; Severity of problem: {Mild/Moderate/Severe:20260}  Patient and/or Family's Strengths/Protective Factors: {CHL AMB BH PROTECTIVE FACTORS:[phone]}  Goals Addressed: Patient will:  Reduce symptoms of: {IBH Symptoms:21014056}   Increase knowledge and/or ability of: {IBH Patient Tools:21014057}   Demonstrate ability to: {IBH Goals:21014053}  Progress towards Goals: {CHL AMB BH PROGRESS TOWARDS GOALS:[phone]}  Interventions: Interventions utilized:  {IBH  Interventions:21014054} Standardized Assessments completed: {IBH Screening Tools:21014051}  Patient and/or Family Response: Patient agrees with treatment plan. ***  Assessment: Patient currently experiencing ***.   Patient may benefit from psychoeducation and brief therapeutic interventions regarding coping with symptoms of *** .  Plan: Follow up with behavioral health clinician on : *** Behavioral recommendations:  -*** -*** Referral(s): {IBH Referrals:21014055}  I discussed the assessment and treatment plan with the patient and/or parent/guardian. They were provided an opportunity to ask questions and all were answered. They agreed with the plan and demonstrated an understanding of the instructions.   They were advised to call back or seek an in-person evaluation if the symptoms worsen or if the condition fails to improve as anticipated.  Garlan Fair, LCSW     03/04/2020    1:57 PM 01/26/2020    9:26 AM 01/08/2020    8:33 AM 12/29/2019    2:53 PM 11/26/2019    4:13 PM  Depression screen PHQ 2/9  Decreased Interest 1 0 '1 2 2  '$ Down, Depressed, Hopeless '3 1 1 2 3  '$ PHQ - 2 Score '4 1 2 4 5  '$ Altered sleeping 1 0 '1 1 2  '$ Tired, decreased energy '2 1 3 2 3  '$ Change in appetite 1 0 0 0 2  Feeling bad or failure about yourself  '3 1 1 1 2  '$ Trouble concentrating 0 0 0 0 1  Moving slowly or fidgety/restless 0 0 0 0 0  Suicidal thoughts 0 0 0 0 0  PHQ-9 Score '11 3 7 8 15      '$ 03/04/2020    1:57 PM 01/26/2020    9:28 AM 01/08/2020    8:34 AM 12/29/2019    2:53 PM  GAD 7 : Generalized Anxiety Score  Nervous, Anxious, on Edge 0 '1 3 3  '$ Control/stop worrying '1 1 3 3  '$ Worry too much -  different things '1 1 3 2  '$ Trouble relaxing 2 0 2 3  Restless 0 0 0 1  Easily annoyed or irritable '3 1 3 3  '$ Afraid - awful might happen 0 0 1 1  Total GAD 7 Score '7 4 15 '$ 16

## 2022-03-09 ENCOUNTER — Ambulatory Visit: Payer: Medicaid Other | Admitting: Clinical

## 2022-03-09 DIAGNOSIS — Z91199 Patient's noncompliance with other medical treatment and regimen due to unspecified reason: Secondary | ICD-10-CM

## 2022-03-09 NOTE — BH Specialist Note (Signed)
Pt did not arrive to video visit and did not answer the phone; Left HIPPA-compliant message to call back Lon Klippel from [facility]  [facility] at  [phone] (Jaleel Allen's office).  ?; left MyChart message for patient.  ? ?

## 2022-06-04 IMAGING — US US MFM OB DETAIL+14 WK
1 series · 13 of 28 positions shown · non-contrast
Comparison: none

[Series 1: us mfm ob detail+14 wk · 13 of 120 slices shown]
[im 5/120]
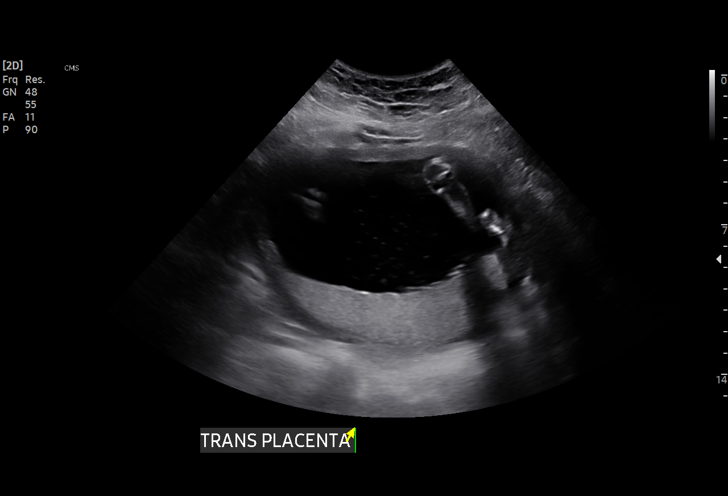
[im 14/120]
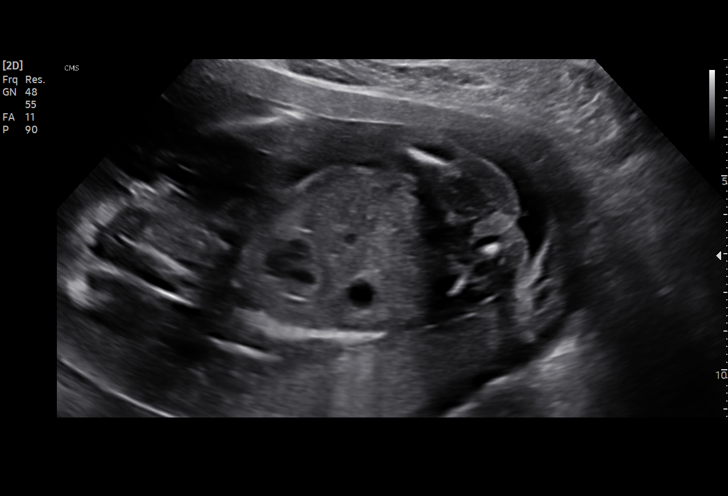
[im 23/120]
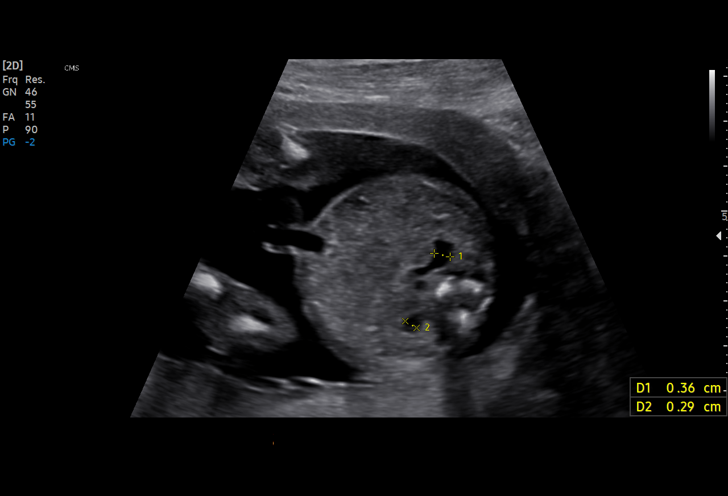
[im 31/120]
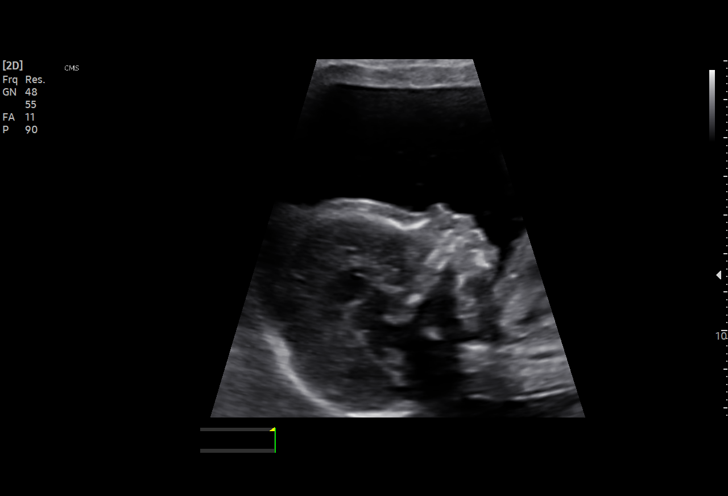
[im 40/120]
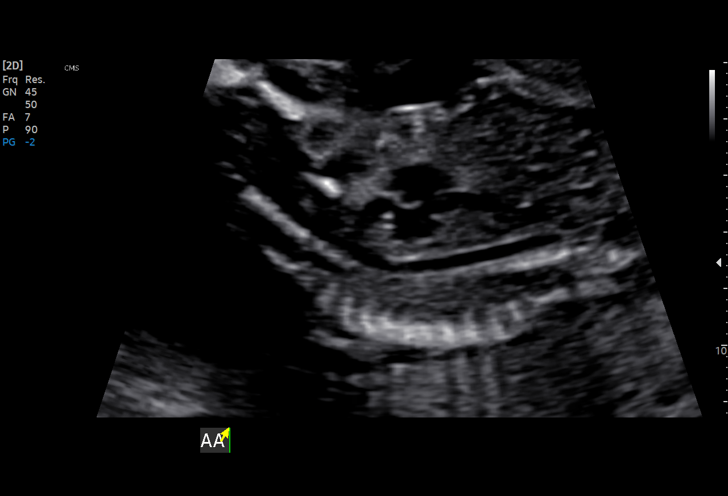
[im 49/120]
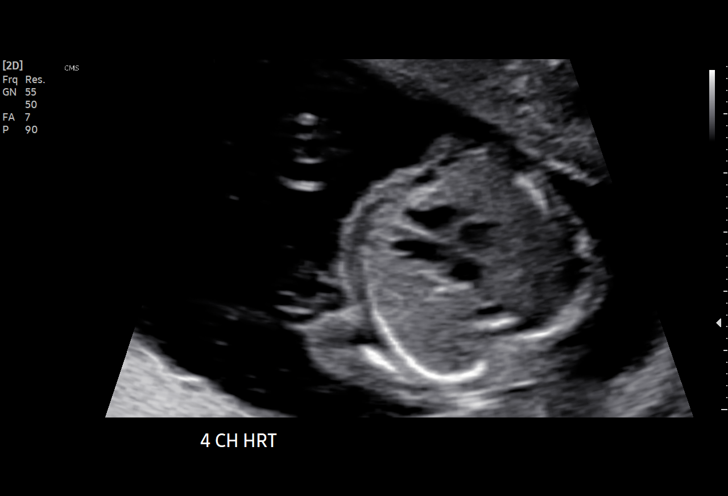
[im 62/120]
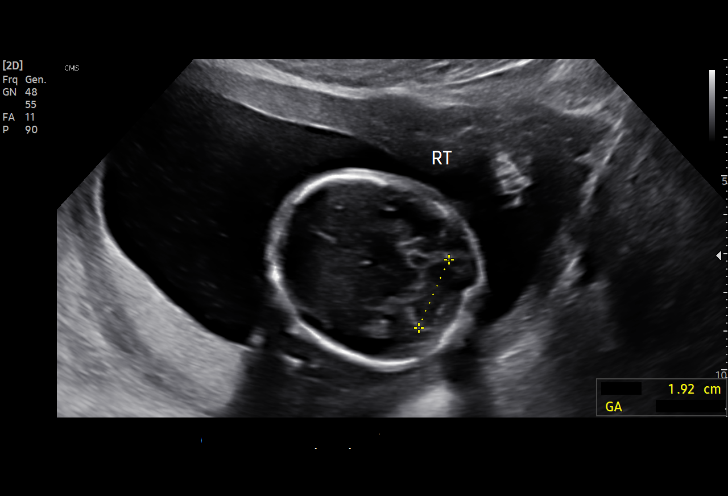
[im 71/120]
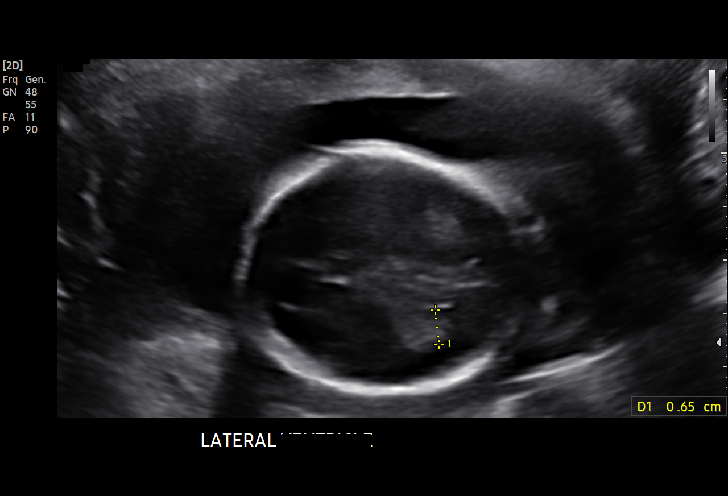
[im 80/120]
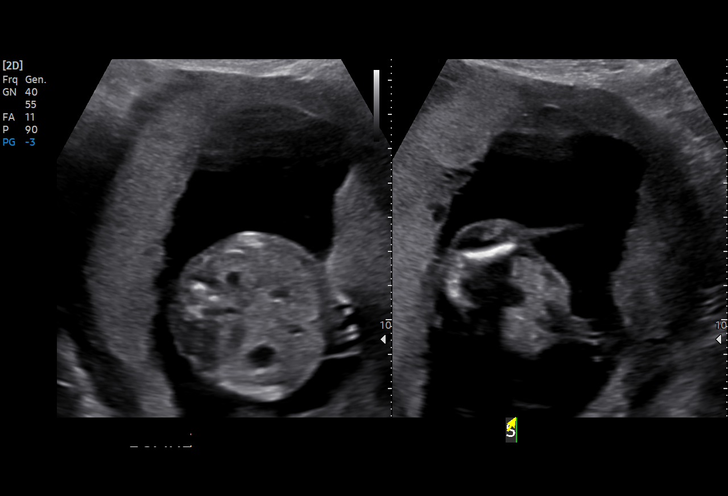
[im 89/120]
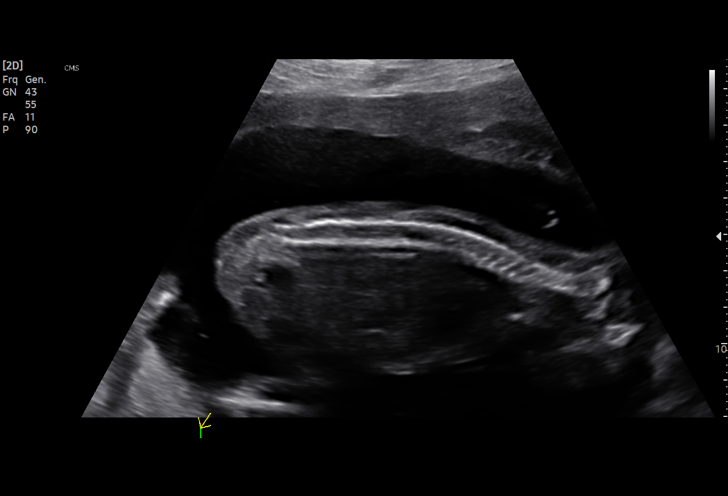
[im 97/120]
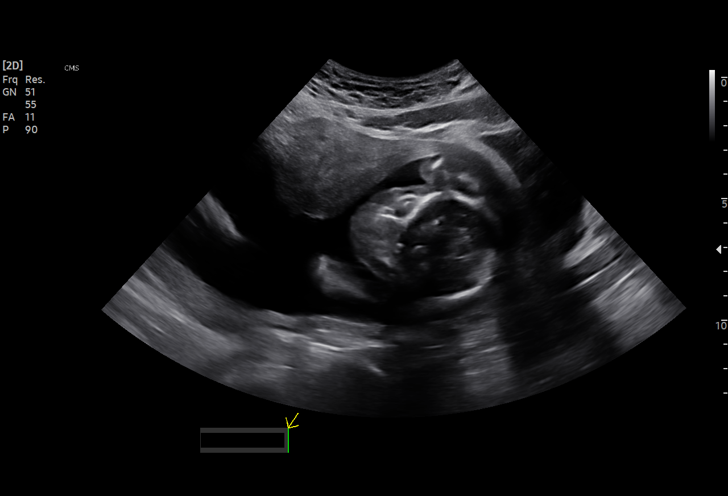
[im 106/120]
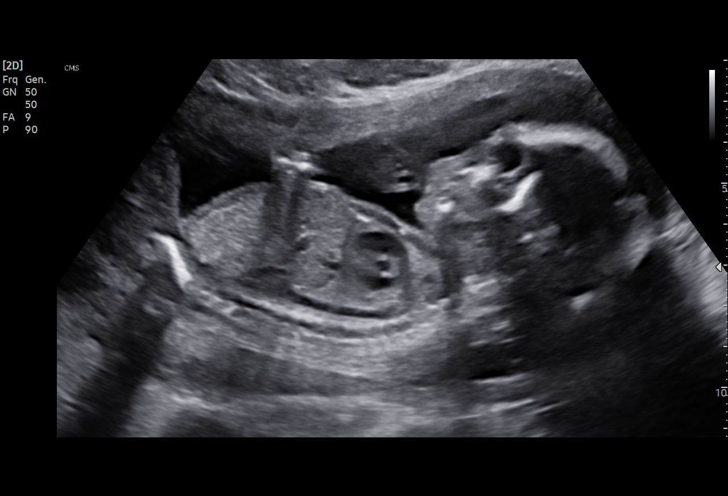
[im 115/120]
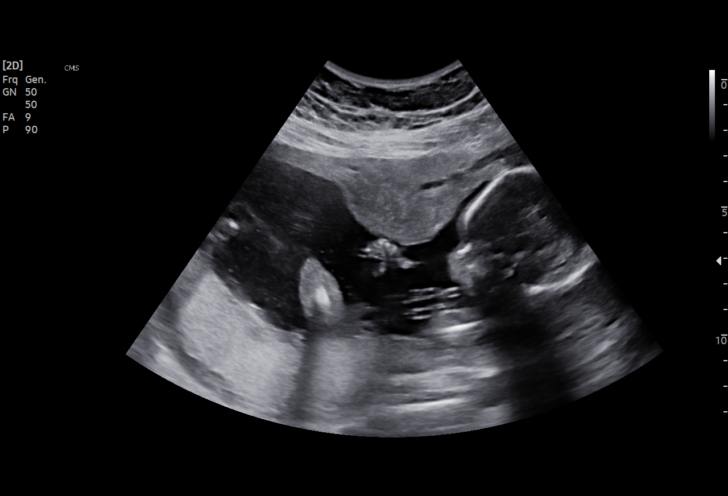

[13 of 28 positions shown; findings below may reference images not displayed]

Indications

 Obesity complicating pregnancy, second
 trimester (pregravid BMI 33)
 19 weeks gestation of pregnancy
 Antenatal screening for malformations
 Genetic carrier (silent Markisic Shigjeqi)
 Previous cesarean delivery, antepartum
Fetal Evaluation

 Num Of Fetuses:         1
 Fetal Heart Rate(bpm):  150
 Cardiac Activity:       Observed
 Presentation:           Variable
 Placenta:               Posterior
 P. Cord Insertion:      Visualized

 Amniotic Fluid
 AFI FV:      Within normal limits

                             Largest Pocket(cm)

Biometry

 BPD:      45.2  mm     G. Age:  19w 5d         78  %    CI:        72.68   %    70 - 86
                                                         FL/HC:      17.1   %    16.1 -
 HC:      168.6  mm     G. Age:  19w 4d         67  %    HC/AC:      1.13        1.09 -
 AC:      149.8  mm     G. Age:  20w 2d         83  %    FL/BPD:     63.7   %
 FL:       28.8  mm     G. Age:  18w 6d         37  %    FL/AC:      19.2   %    20 - 24
 HUM:      29.1  mm     G. Age:  19w 3d         62  %
 CER:      19.2  mm     G. Age:  18w 5d         23  %

 Est. FW:     301  gm    0 lb 11 oz      79  %
OB History

 Gravidity:    2         Term:   1        Prem:   0        SAB:   0
 TOP:          0       Ectopic:  0        Living: 1
Gestational Age

 LMP:           20w 0d        Date:  04/03/19                 EDD:   01/08/20
 U/S Today:     19w 4d                                        EDD:   01/11/20
 Best:          19w 0d     Det. By:  Early Ultrasound         EDD:   01/15/20
                                     (05/29/19)
Anatomy

 Cranium:               Appears normal         Aortic Arch:            Appears normal
 Cavum:                 Appears normal         Ductal Arch:            Appears normal
 Ventricles:            Appears normal         Diaphragm:              Appears normal
 Choroid Plexus:        Appears normal         Stomach:                Appears normal, left
                                                                       sided
 Cerebellum:            Appears normal         Abdomen:                Appears normal
 Posterior Fossa:       Appears normal         Abdominal Wall:         Appears nml (cord
                                                                       insert, abd wall)
 Nuchal Fold:           Appears normal         Cord Vessels:           Appears normal (3
                                                                       vessel cord)
 Face:                  Appears normal         Kidneys:                Appear normal
                        (orbits and profile)
 Lips:                  Appears normal         Bladder:                Appears normal
 Thoracic:              Appears normal         Spine:                  Appears normal
 Heart:                 Appears normal         Upper Extremities:      Appears normal
                        (4CH, axis, and
                        situs)
 RVOT:                  Appears normal         Lower Extremities:      Appears normal
 LVOT:                  Appears normal

 Other:  Heels/feet and open hands/5th digits visualized. Nasal bone
         visualized.
Cervix Uterus Adnexa

 Cervix
 Length:           3.54  cm.
 Normal appearance by transabdominal scan.

 Uterus
 No abnormality visualized.

 Right Ovary
 Within normal limits.

 Left Ovary
 Not visualized.

 Cul De Sac
 No free fluid seen.
 Adnexa
 No abnormality visualized.
Comments

 This patient was seen for a detailed fetal anatomy scan due
 to maternal obesity.
 She denies any significant past medical history and denies
 any problems in her current pregnancy.
 She had a cell free DNA test earlier in her pregnancy which
 indicated a low risk for trisomy 21, 18, and 13.
 She was informed that the fetal growth and amniotic fluid
 level were appropriate for her gestational age.
 There were no obvious fetal anomalies noted on today's
 ultrasound exam.
 The patient was informed that anomalies may be missed due
 to technical limitations. If the fetus is in a suboptimal position
 or maternal habitus is increased, visualization of the fetus in
 the maternal uterus may be impaired.
 Follow up as indicated.
